# Patient Record
Sex: Female | Born: 1950 | Race: White | Hispanic: No | Marital: Married | Smoking: Never smoker
Health system: Southern US, Community
[De-identification: ages and names within clinical notes are randomized; demographics above are authoritative.]

## PROBLEM LIST (undated history)

## (undated) DIAGNOSIS — C4491 Basal cell carcinoma of skin, unspecified: Secondary | ICD-10-CM

## (undated) DIAGNOSIS — C4492 Squamous cell carcinoma of skin, unspecified: Secondary | ICD-10-CM

## (undated) DIAGNOSIS — Z789 Other specified health status: Secondary | ICD-10-CM

## (undated) DIAGNOSIS — Z9889 Other specified postprocedural states: Secondary | ICD-10-CM

## (undated) HISTORY — PX: COLONOSCOPY: SHX174

## (undated) HISTORY — PX: BREAST ENHANCEMENT SURGERY: SHX7

---
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
## 1979-09-12 HISTORY — PX: BREAST ENHANCEMENT SURGERY: SHX7

## 1990-09-11 HISTORY — PX: AUGMENTATION MAMMAPLASTY: SUR837

## 1998-05-26 ENCOUNTER — Other Ambulatory Visit: Admission: RE | Admit: 1998-05-26 | Discharge: 1998-05-26 | Payer: Self-pay | Admitting: Gynecology

## 1998-07-15 ENCOUNTER — Other Ambulatory Visit: Admission: RE | Admit: 1998-07-15 | Discharge: 1998-07-15 | Payer: Self-pay | Admitting: Internal Medicine

## 1998-11-25 ENCOUNTER — Other Ambulatory Visit: Admission: RE | Admit: 1998-11-25 | Discharge: 1998-11-25 | Payer: Self-pay | Admitting: Gynecology

## 1999-05-30 ENCOUNTER — Other Ambulatory Visit: Admission: RE | Admit: 1999-05-30 | Discharge: 1999-05-30 | Payer: Self-pay | Admitting: Gynecology

## 1999-12-05 ENCOUNTER — Other Ambulatory Visit: Admission: RE | Admit: 1999-12-05 | Discharge: 1999-12-05 | Payer: Self-pay | Admitting: Gynecology

## 2000-01-06 ENCOUNTER — Ambulatory Visit (HOSPITAL_COMMUNITY): Admission: RE | Admit: 2000-01-06 | Discharge: 2000-01-06 | Payer: Self-pay | Admitting: Gynecology

## 2000-01-06 ENCOUNTER — Encounter (INDEPENDENT_AMBULATORY_CARE_PROVIDER_SITE_OTHER): Payer: Self-pay | Admitting: Specialist

## 2000-04-02 ENCOUNTER — Other Ambulatory Visit: Admission: RE | Admit: 2000-04-02 | Discharge: 2000-04-02 | Payer: Self-pay | Admitting: Gynecology

## 2001-01-28 ENCOUNTER — Other Ambulatory Visit: Admission: RE | Admit: 2001-01-28 | Discharge: 2001-01-28 | Payer: Self-pay | Admitting: Gynecology

## 2001-10-24 ENCOUNTER — Other Ambulatory Visit: Admission: RE | Admit: 2001-10-24 | Discharge: 2001-10-24 | Payer: Self-pay | Admitting: Gynecology

## 2001-12-30 ENCOUNTER — Encounter: Admission: RE | Admit: 2001-12-30 | Discharge: 2001-12-30 | Payer: Self-pay | Admitting: Gynecology

## 2001-12-30 ENCOUNTER — Encounter: Payer: Self-pay | Admitting: Gynecology

## 2002-05-29 ENCOUNTER — Encounter: Admission: RE | Admit: 2002-05-29 | Discharge: 2002-05-29 | Payer: Self-pay | Admitting: Plastic Surgery

## 2002-05-29 ENCOUNTER — Encounter: Payer: Self-pay | Admitting: Plastic Surgery

## 2002-06-06 ENCOUNTER — Ambulatory Visit (HOSPITAL_COMMUNITY): Admission: RE | Admit: 2002-06-06 | Discharge: 2002-06-06 | Payer: Self-pay | Admitting: Plastic Surgery

## 2004-01-05 ENCOUNTER — Encounter: Admission: RE | Admit: 2004-01-05 | Discharge: 2004-01-05 | Payer: Self-pay | Admitting: Unknown Physician Specialty

## 2004-07-14 ENCOUNTER — Ambulatory Visit: Payer: Self-pay | Admitting: Internal Medicine

## 2004-09-08 ENCOUNTER — Ambulatory Visit: Payer: Self-pay | Admitting: Internal Medicine

## 2004-09-19 ENCOUNTER — Ambulatory Visit: Payer: Self-pay | Admitting: Internal Medicine

## 2005-03-22 ENCOUNTER — Encounter: Admission: RE | Admit: 2005-03-22 | Discharge: 2005-03-22 | Payer: Self-pay | Admitting: Unknown Physician Specialty

## 2005-05-11 ENCOUNTER — Encounter: Admission: RE | Admit: 2005-05-11 | Discharge: 2005-05-11 | Payer: Self-pay | Admitting: Unknown Physician Specialty

## 2005-10-18 ENCOUNTER — Encounter: Admission: RE | Admit: 2005-10-18 | Discharge: 2005-10-18 | Payer: Self-pay | Admitting: Unknown Physician Specialty

## 2005-10-25 ENCOUNTER — Encounter: Admission: RE | Admit: 2005-10-25 | Discharge: 2005-10-25 | Payer: Self-pay | Admitting: Unknown Physician Specialty

## 2005-11-15 ENCOUNTER — Encounter: Admission: RE | Admit: 2005-11-15 | Discharge: 2005-11-15 | Payer: Self-pay | Admitting: Unknown Physician Specialty

## 2006-01-03 ENCOUNTER — Encounter: Admission: RE | Admit: 2006-01-03 | Discharge: 2006-01-03 | Payer: Self-pay | Admitting: Unknown Physician Specialty

## 2006-01-05 ENCOUNTER — Ambulatory Visit: Payer: Self-pay | Admitting: Internal Medicine

## 2006-01-10 ENCOUNTER — Encounter: Admission: RE | Admit: 2006-01-10 | Discharge: 2006-01-10 | Payer: Self-pay | Admitting: Unknown Physician Specialty

## 2006-02-07 ENCOUNTER — Encounter: Admission: RE | Admit: 2006-02-07 | Discharge: 2006-02-07 | Payer: Self-pay | Admitting: Unknown Physician Specialty

## 2006-10-31 ENCOUNTER — Encounter: Admission: RE | Admit: 2006-10-31 | Discharge: 2006-10-31 | Payer: Self-pay | Admitting: Unknown Physician Specialty

## 2007-04-26 ENCOUNTER — Encounter: Admission: RE | Admit: 2007-04-26 | Discharge: 2007-04-26 | Payer: Self-pay | Admitting: *Deleted

## 2007-07-18 ENCOUNTER — Encounter: Admission: RE | Admit: 2007-07-18 | Discharge: 2007-07-18 | Payer: Self-pay | Admitting: *Deleted

## 2007-07-18 ENCOUNTER — Encounter: Admission: RE | Admit: 2007-07-18 | Discharge: 2007-07-18 | Payer: Self-pay | Admitting: Interventional Radiology

## 2007-09-18 IMAGING — US IR TRANSCATH EMBOLIZATION NON-NEURO EA OP FIELD
1 series · 4 of 4 positions shown · non-contrast
Comparison: none

CLINICAL DATA: Symptomatic left lower extremity varicose veins.

[Series 1: unknown · 0.07mm/px · 4 of 4 slices shown]
[im 1/4]
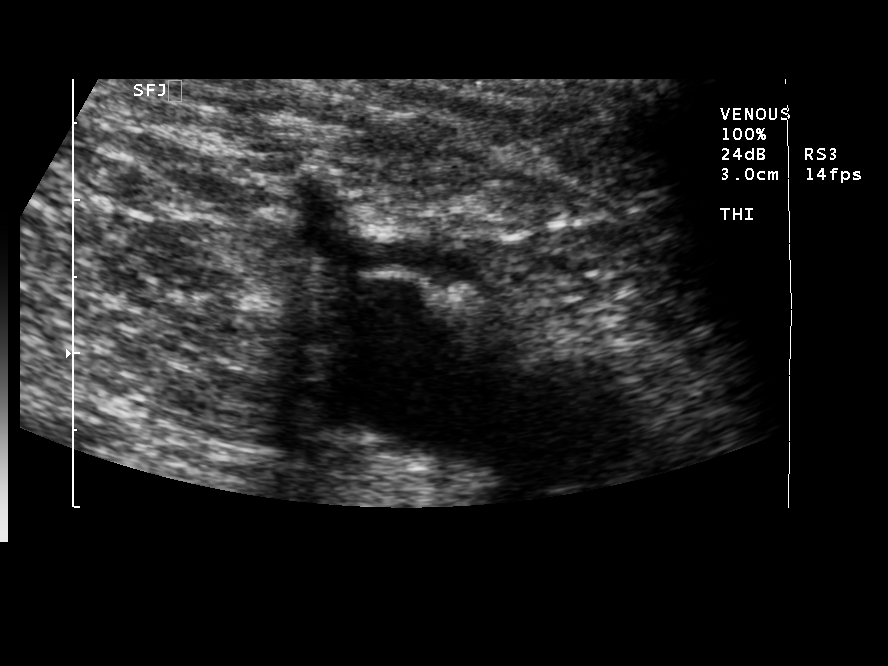
[im 2/4]
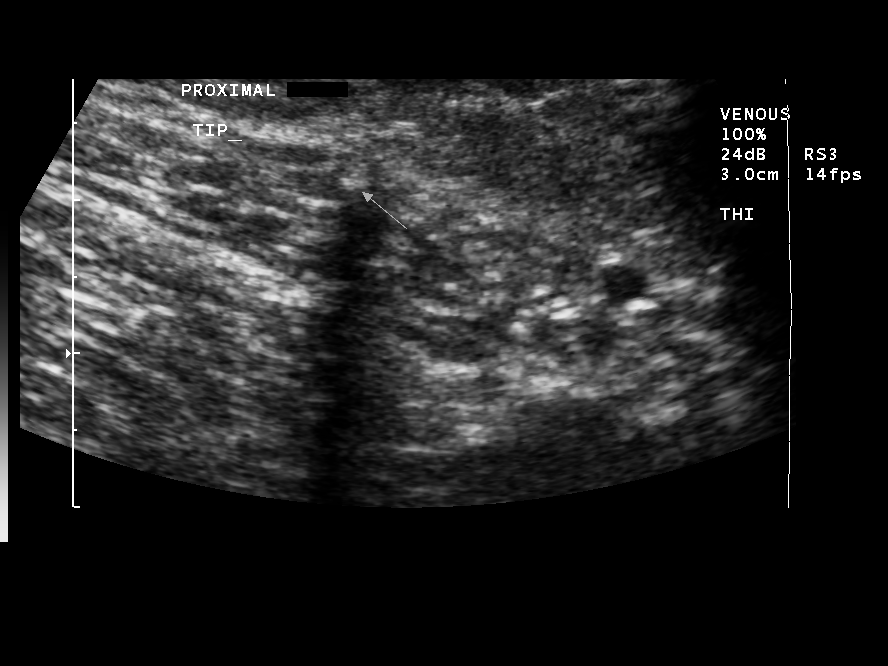
[im 3/4]
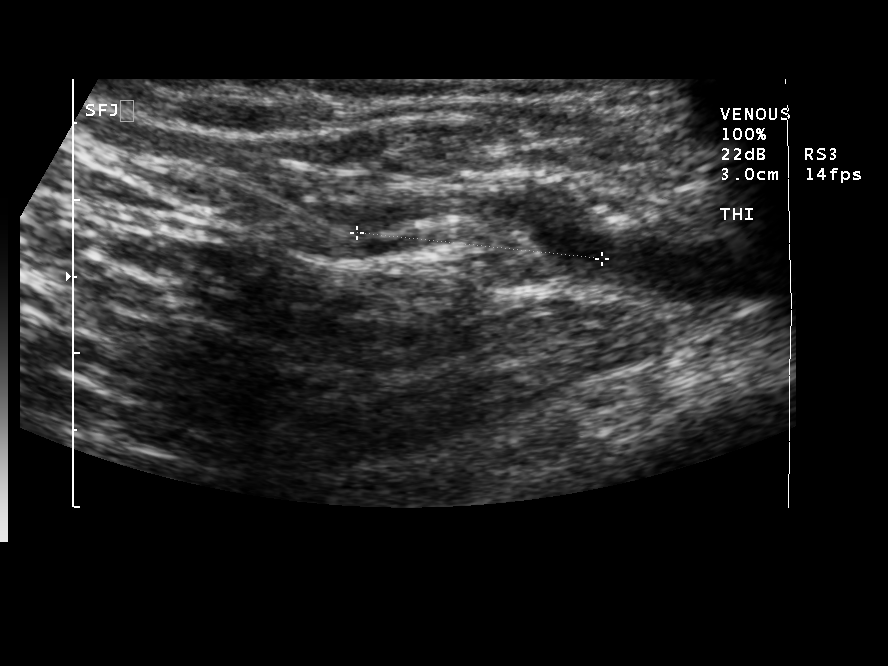
[im 4/4]
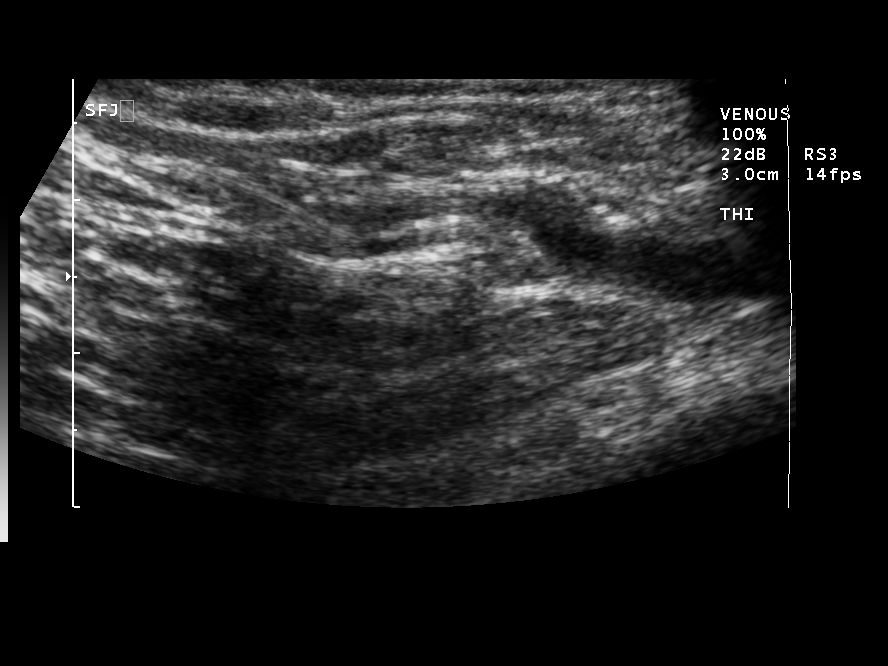

[4 of 4 positions shown; findings below may reference images not displayed]

TRANSCATHETER LASER OCCLUSION  LEFT  GREATER SAPHENOUS VEIN WITH ULTRASOUND
GUIDANCE:

Survey ultrasound of the leg was performed and the course of the greater
saphenous vein was marked on the skin. Overlying skin prepped with Betadine,
draped in usual sterile fashion. After local anesthetic using 1% lidocaine, the
greater saphenous vein was accessed at the lower calf level under ultrasound
with a 21-gauge micropuncture needle. A 018 guidewire advanced easily. This was
exchanged using a transitional dilator for a 035 J wire. Over this, the 6 French
delivery sheath was advanced beyond the saphenofemoral junction. The laser fiber
was advanced and positioned greater than 15 mm  from the saphenofemoral
junction. Tumescent dilute 0.1% lidocaine 350 mL used along the planned
treatment length. Ultrasound was used to confirm appropriate tumescent
anesthesia and to verify that all segments were at least 1 cm from the skin
surface. The laser fiber was activated while being withdrawn over the treatment
length, delivering 1466 joules over 261 seconds. The catheter and sheath were
removed and hemostasis easily achieved at the site. Patient was placed in
graduated compression stockings and ambulated for 20 minutes without difficulty.
Patient tolerated the procedure well, with no immediate complication.
IMPRESSION: 1. Technically successful transcatheter laser occlusion of left  greater
saphenous vein. Patient will followup in clinic in one week. Patient knows to

## 2008-01-21 ENCOUNTER — Encounter: Admission: RE | Admit: 2008-01-21 | Discharge: 2008-01-21 | Payer: Self-pay | Admitting: Unknown Physician Specialty

## 2008-06-04 ENCOUNTER — Ambulatory Visit (HOSPITAL_COMMUNITY)
Admission: RE | Admit: 2008-06-04 | Discharge: 2008-06-04 | Payer: Self-pay | Admitting: Physical Medicine and Rehabilitation

## 2009-06-03 ENCOUNTER — Encounter: Admission: RE | Admit: 2009-06-03 | Discharge: 2009-06-03 | Payer: Self-pay | Admitting: Unknown Physician Specialty

## 2010-10-01 ENCOUNTER — Encounter: Payer: Self-pay | Admitting: Unknown Physician Specialty

## 2010-10-01 ENCOUNTER — Encounter: Payer: Self-pay | Admitting: Interventional Radiology

## 2010-10-02 ENCOUNTER — Encounter: Payer: Self-pay | Admitting: Unknown Physician Specialty

## 2010-10-02 ENCOUNTER — Encounter: Payer: Self-pay | Admitting: Interventional Radiology

## 2010-10-03 ENCOUNTER — Encounter: Payer: Self-pay | Admitting: Unknown Physician Specialty

## 2011-01-27 NOTE — Op Note (Signed)
[facility]  Patient:    AFSHEEN, ANTONY                     MRN: [id number] Proc. Date: 01/06/00 Adm. Date:  [id number] Disc. Date: [id number] Attending:  Susa Raring                           Operative Report  PREOPERATIVE DIAGNOSIS: 1. Abnormal cervical cytology. 2. Inadequate colposcopy.  POSTOPERATIVE DIAGNOSIS: 1. Abnormal cervical cytology. 2. Inadequate colposcopy.  OPERATION PERFORMED:  Cold knife conization of the cervix and dilatation and curettage.  SURGEON:  Luvenia Redden, M.D.  DESCRIPTION OF PROCEDURE:  Under good anesthesia, the patient prepped and draped in sterile manner.  The bladder was catheterized.  Examination revealed the uterus  midposition, normal size, slightly irregular.  It is prolapsed 2 degrees. There are no masses in the pelvis.  The cervix was grasped at the 12 oclock position with the single-toothed tenaculum.  A 0 Vicryl suture was placed in several bites submucosally to surround the cervix and this was laid up on the abdomen.  A cone-shaped excisional biopsy was made in the central portion of the cervix. Biopsy specimen itself was marked at the 12 oclock position with a single suture. Following this some bleeders in the operative site were cauterized with electrocoagulization.  The internal os was dilated.  Thorough curettage was done and this was sent as a separate specimen.  Sound was placed in the canal.  The encerclage suture was tied down securely against the sound so as to ensure patency of the canal.  Operative site was observed for several minutes.  There was no excess bleeding.  The sponge was left in the vagina up next to the cervix and this will be removed in the recovery room in approximately 30 minutes.  Blood loss was approximately 50 cc.  None was replaced.  The patient tolerated the procedure well. She was removed to recovery in good condition. DD:  01/06/00 TD:  01/09/00 Job:  [id number]

## 2011-01-27 NOTE — Op Note (Signed)
[facility]  Patient:    Sydney Shaw, Sydney Shaw                     MRN: [id number] Proc. Date: 01/06/00 Adm. Date:  [id number] Attending:  Susa Raring                           Operative Report  PREOPERATIVE DIAGNOSES: 1. Abnormal cervical cytology. 2. Inadequate colposcopy.  POSTOPERATIVE DIAGNOSES: 1. Abnormal cervical cytology. 2. Inadequate colposcopy.  OPERATION:  Cold knife conization of the cervix and D&C.  SURGEON:  Luvenia Redden, M.D.  PROCEDURE:  Under good anesthesia, the patient was prepped and draped in a sterile manner.  The bladder was catheterized.  Examination revealed the uterus mid position, normal size, slightly irregular.  It is prolapsed two degrees.  There are no masses in the pelvis.  Cervix was grasped at 12 oclock position with a single tooth tenaculum.  A 0 Vicryl suture was placed in several bites submucosally to  surround the cervix and this was laid up on the abdomen then.  A cone shaped excisional biopsy was made in the central portion of the cervix.  Biopsy specimen itself was marked at the 12 oclock position with a single suture.  Following this some bleeders in the operative site were cauterized with electrocoagulation. Internal os was dilated.  Thorough curettage was done and this was sent as a separate specimen.  _________ was placed in the canal.  The cerclage suture was  then tied down securely against the sound to ensure patency of the canal. Operative site was observed for several minutes.  There was no excess bleeding.  Sponge was left in the vagina up next to the cervix and this will be removed in the recovery room in approximately 30 minutes.  BLOOD LOSS:  Blood loss was approximately 50 cc.  None was replaced.  Patient tolerated the procedure well and she was removed to recovery in good condition. DD:  01/06/00 TD:  01/08/00 Job: [id number]

## 2011-04-17 ENCOUNTER — Other Ambulatory Visit: Payer: Self-pay | Admitting: Unknown Physician Specialty

## 2011-04-17 DIAGNOSIS — Z1231 Encounter for screening mammogram for malignant neoplasm of breast: Secondary | ICD-10-CM

## 2011-05-23 ENCOUNTER — Ambulatory Visit
Admission: RE | Admit: 2011-05-23 | Discharge: 2011-05-23 | Disposition: A | Discharge status: Home / Self Care | Payer: 59 | Source: Ambulatory Visit | Attending: Unknown Physician Specialty | Admitting: Unknown Physician Specialty

## 2011-05-23 DIAGNOSIS — Z1231 Encounter for screening mammogram for malignant neoplasm of breast: Secondary | ICD-10-CM

## 2012-05-16 ENCOUNTER — Other Ambulatory Visit: Payer: Self-pay | Admitting: Unknown Physician Specialty

## 2012-05-16 DIAGNOSIS — Z1231 Encounter for screening mammogram for malignant neoplasm of breast: Secondary | ICD-10-CM

## 2012-06-13 ENCOUNTER — Ambulatory Visit
Admission: RE | Admit: 2012-06-13 | Discharge: 2012-06-13 | Disposition: A | Discharge status: Home / Self Care | Payer: 59 | Source: Ambulatory Visit | Attending: Unknown Physician Specialty | Admitting: Unknown Physician Specialty

## 2012-06-13 DIAGNOSIS — Z1231 Encounter for screening mammogram for malignant neoplasm of breast: Secondary | ICD-10-CM

## 2013-06-02 ENCOUNTER — Other Ambulatory Visit: Payer: Self-pay

## 2013-06-02 DIAGNOSIS — Z1231 Encounter for screening mammogram for malignant neoplasm of breast: Secondary | ICD-10-CM

## 2013-06-23 ENCOUNTER — Ambulatory Visit
Admission: RE | Admit: 2013-06-23 | Discharge: 2013-06-23 | Disposition: A | Discharge status: Home / Self Care | Payer: 59 | Source: Ambulatory Visit

## 2013-06-23 DIAGNOSIS — Z1231 Encounter for screening mammogram for malignant neoplasm of breast: Secondary | ICD-10-CM

## 2014-05-25 ENCOUNTER — Other Ambulatory Visit: Payer: Self-pay

## 2014-05-25 DIAGNOSIS — Z1231 Encounter for screening mammogram for malignant neoplasm of breast: Secondary | ICD-10-CM

## 2014-07-14 ENCOUNTER — Ambulatory Visit
Admission: RE | Admit: 2014-07-14 | Discharge: 2014-07-14 | Disposition: A | Discharge status: Home / Self Care | Payer: 59 | Source: Ambulatory Visit

## 2014-07-14 DIAGNOSIS — Z1231 Encounter for screening mammogram for malignant neoplasm of breast: Secondary | ICD-10-CM

## 2015-06-25 ENCOUNTER — Telehealth: Payer: Self-pay

## 2015-06-25 NOTE — Telephone Encounter (Signed)
PATIENT CALLED TO SCHEDULE HER TCS  RECEIVED LETTER IN June  [phone]

## 2015-06-30 ENCOUNTER — Telehealth: Payer: Self-pay

## 2015-06-30 ENCOUNTER — Other Ambulatory Visit: Payer: Self-pay

## 2015-06-30 DIAGNOSIS — Z1211 Encounter for screening for malignant neoplasm of colon: Secondary | ICD-10-CM

## 2015-06-30 NOTE — Telephone Encounter (Signed)
Hold iron 7 days.  Let's do suprep.

## 2015-06-30 NOTE — Telephone Encounter (Signed)
Gastroenterology Pre-Procedure Review  Request Date: 06/30/2015 Requesting Physician: Cathlean Sauer PA [facility])   PATIENT REVIEW QUESTIONS: The patient responded to the following health history questions as indicated:    Pt said she had one colonoscopy about 1998 by Delfin Edis at [facility] Next was recommended in 10 years per her  Pt said she absolutely cannot drink the regular preps/ she was given Mag Citrate and Doculax previously  1. Diabetes Melitis: no 2. Joint replacements in the past 12 months: no 3. Major health problems in the past 3 months: no 4. Has an artificial valve or MVP: no 5. Has a defibrillator: no 6. Has been advised in past to take antibiotics in advance of a procedure like teeth cleaning: no 7. Family history of colon cancer: no  8. Alcohol Use: no 9. History of sleep apnea: no     MEDICATIONS & ALLERGIES:    Patient reports the following regarding taking any blood thinners:   Plavix? no Aspirin? no Coumadin? no  Patient confirms/reports the following medications:  Current Outpatient Prescriptions  Medication Sig Dispense Refill  . Biotin 1000 MCG tablet Take 1,000 mcg by mouth daily.    Marland Kitchen estradiol (ESTRACE) 2 MG tablet Take 2 mg by mouth daily.    Marland Kitchen gabapentin (NEURONTIN) 300 MG capsule Take 300 mg by mouth daily.    . medroxyPROGESTERone (PROVERA) 2.5 MG tablet Take 2.5 mg by mouth daily.    . NON FORMULARY Iron  27 mg daily    . NON FORMULARY Chewable Calcium     Not sure strength   One daily    . NON FORMULARY Probiotic   daily     No current facility-administered medications for this visit.    Patient confirms/reports the following allergies:  No Known Allergies  No orders of the defined types were placed in this encounter.    AUTHORIZATION INFORMATION Primary Insurance:  ID #:   Group #:  Pre-Cert / Auth required:  Pre-Cert / Auth #:   Secondary Insurance:  ID #:   Group #:  Pre-Cert / Auth required:  Pre-Cert / Auth #:    SCHEDULE INFORMATION: Procedure has been scheduled as follows:  Date:  07/16/2015               Time:  10:45 AM Location: [facility] Short Stay  This Gastroenterology Pre-Precedure Review Form is being routed to the following provider(s): R. Garfield Cornea, MD

## 2015-07-01 MED ORDER — NA SULFATE-K SULFATE-MG SULF 17.5-3.13-1.6 GM/177ML PO SOLN: 1.0000 | ORAL | Status: DC

## 2015-07-01 NOTE — Telephone Encounter (Signed)
Rx sent to the pharmacy and instructions mailed to pt.  

## 2015-07-14 ENCOUNTER — Telehealth: Payer: Self-pay

## 2015-07-14 NOTE — Telephone Encounter (Signed)
I called [employer] @ [phone] and spoke to [place] who said a PA is required for the screening colonoscopy as out patient at facility.  Pending Reference # [id number].   She is placing on high priority.

## 2015-07-16 ENCOUNTER — Ambulatory Visit (HOSPITAL_COMMUNITY)
Admission: RE | Admit: 2015-07-16 | Discharge: 2015-07-16 | Disposition: A | Discharge status: Home / Self Care | Payer: 59 | Source: Ambulatory Visit | Attending: Internal Medicine | Admitting: Internal Medicine

## 2015-07-16 ENCOUNTER — Encounter (HOSPITAL_COMMUNITY)
Admission: RE | Disposition: A | Discharge status: Home / Self Care | Payer: Self-pay | Source: Ambulatory Visit | Attending: Internal Medicine

## 2015-07-16 ENCOUNTER — Encounter (HOSPITAL_COMMUNITY): Payer: Self-pay | Admitting: *Deleted

## 2015-07-16 DIAGNOSIS — Z1211 Encounter for screening for malignant neoplasm of colon: Secondary | ICD-10-CM | POA: Diagnosis not present

## 2015-07-16 DIAGNOSIS — Z86010 Personal history of colonic polyps: Secondary | ICD-10-CM | POA: Insufficient documentation

## 2015-07-16 DIAGNOSIS — Z79899 Other long term (current) drug therapy: Secondary | ICD-10-CM | POA: Insufficient documentation

## 2015-07-16 DIAGNOSIS — D124 Benign neoplasm of descending colon: Secondary | ICD-10-CM | POA: Diagnosis not present

## 2015-07-16 DIAGNOSIS — D122 Benign neoplasm of ascending colon: Secondary | ICD-10-CM | POA: Insufficient documentation

## 2015-07-16 HISTORY — DX: Other specified health status: Z78.9

## 2015-07-16 HISTORY — PX: COLONOSCOPY: SHX5424

## 2015-07-16 SURGERY — COLONOSCOPY
Anesthesia: Moderate Sedation

## 2015-07-16 MED ORDER — MEPERIDINE HCL 100 MG/ML IJ SOLN
INTRAMUSCULAR | Status: AC
  Filled 2015-07-16: qty 2

## 2015-07-16 MED ORDER — ONDANSETRON HCL 4 MG/2ML IJ SOLN
INTRAMUSCULAR | Status: AC
  Filled 2015-07-16: qty 2

## 2015-07-16 MED ORDER — STERILE WATER FOR IRRIGATION IR SOLN
Status: DC | PRN
  Administered 2015-07-16: 10:00:00

## 2015-07-16 MED ORDER — ONDANSETRON HCL 4 MG/2ML IJ SOLN
INTRAMUSCULAR | Status: DC | PRN
  Administered 2015-07-16: 4 mg via INTRAVENOUS

## 2015-07-16 MED ORDER — MEPERIDINE HCL 100 MG/ML IJ SOLN
INTRAMUSCULAR | Status: DC | PRN
  Administered 2015-07-16: 50 mg via INTRAVENOUS

## 2015-07-16 MED ORDER — SODIUM CHLORIDE 0.9 % IV SOLN
INTRAVENOUS | Status: DC
  Administered 2015-07-16: 1000 mL via INTRAVENOUS

## 2015-07-16 MED ORDER — MIDAZOLAM HCL 5 MG/5ML IJ SOLN
INTRAMUSCULAR | Status: DC | PRN
  Administered 2015-07-16: 1 mg via INTRAVENOUS
  Administered 2015-07-16: 2 mg via INTRAVENOUS
  Administered 2015-07-16: 1 mg via INTRAVENOUS

## 2015-07-16 MED ORDER — MIDAZOLAM HCL 5 MG/5ML IJ SOLN
INTRAMUSCULAR | Status: AC
  Filled 2015-07-16: qty 10

## 2015-07-16 NOTE — Op Note (Signed)
[facility] [address] [place], [address]   COLONOSCOPY PROCEDURE REPORT  PATIENT: Sydney Shaw, Sydney Shaw  MR#: [id number] BIRTHDATE: 1951-05-09 , 67  yrs. old GENDER: female ENDOSCOPIST: R.  Garfield Cornea, MD FACP [facility] REFERRED KW:IOXBD Quillian Quince, M.D. PROCEDURE DATE:  08-05-15 PROCEDURE:   Colonoscopy with snare polypectomy INDICATIONS:Average risk colorectal cancer screening examination. MEDICATIONS: Versed 4 mg IV and Demerol 50 mg IV in divided doses. Zofran 4 mg IV. ASA CLASS:       Class II  CONSENT: The risks, benefits, alternatives and imponderables including but not limited to bleeding, perforation as well as the possibility of a missed lesion have been reviewed.  The potential for biopsy, lesion removal, etc. have also been discussed. Questions have been answered.  All parties agreeable.  Please see the history and physical in the medical record for more information.  DESCRIPTION OF PROCEDURE:   After the risks benefits and alternatives of the procedure were thoroughly explained, informed consent was obtained.  The digital rectal exam revealed no abnormalities of the rectum.   The EC-[id number] ([id number])  endoscope was introduced through the anus and advanced to the cecum, which was identified by both the appendix and ileocecal valve. No adverse events experienced.   The quality of the prep was adequate  The instrument was then slowly withdrawn as the colon was fully examined. Estimated blood loss is zero unless otherwise noted in this procedure report.      COLON FINDINGS: Normal-appearing rectal mucosa.  (1) 5 millimeter polyp in the mid descending segment and (1) 5 mm polyp in the mid ascending segment; otherwise, the remainder of the colonic mucosa appeared normal.  The above-mentioned polyps were cold snare removed.  Retroflexion was performed. .  Withdrawal time=6 minutes 0 seconds.  The scope was withdrawn and the procedure  completed. COMPLICATIONS: There were no immediate complications. EBL 2 mL ENDOSCOPIC IMPRESSION: Multiple colonic polyps?"removed as described above  RECOMMENDATIONS: Follow up on pathology.  eSigned:  R. Garfield Cornea, MD [facility]alita [facility]ss[facility] 08-05-15 10:51 AM   cc:  CPT CODES: ICD CODES:  The ICD and CPT codes recommended by this software are interpretations from the data that the clinical staff has captured with the software.  The verification of the translation of this report to the ICD and CPT codes and modifiers is the sole responsibility of the health care institution and practicing physician where this report was generated.  [employer] will not be held responsible for the validity of the ICD and CPT codes included on this report.  AMA assumes no liability for data contained or not contained herein. CPT is a Designer, television/film set of the [employer].  PATIENT NAME:  Sydney Shaw, Sydney Shaw MR#: [id number]

## 2015-07-16 NOTE — Discharge Instructions (Signed)
°Colonoscopy °Discharge Instructions ° °Read the instructions outlined below and refer to this sheet in the next few weeks. These discharge instructions provide you with general information on caring for yourself after you leave the hospital. Your doctor may also give you specific instructions. While your treatment has been planned according to the most current medical practices available, unavoidable complications occasionally occur. If you have any problems or questions after discharge, call Dr. Rourk at [phone]. °ACTIVITY °· You may resume your regular activity, but move at a slower pace for the next 24 hours.  °· Take frequent rest periods for the next 24 hours.  °· Walking will help get rid of the air and reduce the bloated feeling in your belly (abdomen).  °· No driving for 24 hours (because of the medicine (anesthesia) used during the test).   °· Do not sign any important legal documents or operate any machinery for 24 hours (because of the anesthesia used during the test).  °NUTRITION °· Drink plenty of fluids.  °· You may resume your normal diet as instructed by your doctor.  °· Begin with a light meal and progress to your normal diet. Heavy or fried foods are harder to digest and may make you feel sick to your stomach (nauseated).  °· Avoid alcoholic beverages for 24 hours or as instructed.  °MEDICATIONS °· You may resume your normal medications unless your doctor tells you otherwise.  °WHAT YOU CAN EXPECT TODAY °· Some feelings of bloating in the abdomen.  °· Passage of more gas than usual.  °· Spotting of blood in your stool or on the toilet paper.  °IF YOU HAD POLYPS REMOVED DURING THE COLONOSCOPY: °· No aspirin products for 7 days or as instructed.  °· No alcohol for 7 days or as instructed.  °· Eat a soft diet for the next 24 hours.  °FINDING OUT THE RESULTS OF YOUR TEST °Not all test results are available during your visit. If your test results are not back during the visit, make an appointment  with your caregiver to find out the results. Do not assume everything is normal if you have not heard from your caregiver or the medical facility. It is important for you to follow up on all of your test results.  °SEEK IMMEDIATE MEDICAL ATTENTION IF: °· You have more than a spotting of blood in your stool.  °· Your belly is swollen (abdominal distention).  °· You are nauseated or vomiting.  °· You have a temperature over 101.  °· You have abdominal pain or discomfort that is severe or gets worse throughout the day.  ° °Polyp information provided ° °Further recommendations to follow pending review of pathology report ° °Colon Polyps °Polyps are lumps of extra tissue growing inside the body. Polyps can grow in the large intestine (colon). Most colon polyps are noncancerous (benign). However, some colon polyps can become cancerous over time. Polyps that are larger than a pea may be harmful. To be safe, caregivers remove and test all polyps. °CAUSES  °Polyps form when mutations in the genes cause your cells to grow and divide even though no more tissue is needed. °RISK FACTORS °There are a number of risk factors that can increase your chances of getting colon polyps. They include: °· Being older than 50 years. °· Family history of colon polyps or colon cancer. °· Long-term colon diseases, such as colitis or Crohn disease. °· Being overweight. °· Smoking. °· Being inactive. °· Drinking too much alcohol. °SYMPTOMS  °  Most small polyps do not cause symptoms. If symptoms are present, they may include: °· Blood in the stool. The stool may look dark red or black. °· Constipation or diarrhea that lasts longer than 1 week. °DIAGNOSIS °People often do not know they have polyps until their caregiver finds them during a regular checkup. Your caregiver can use 4 tests to check for polyps: °· Digital rectal exam. The caregiver wears gloves and feels inside the rectum. This test would find polyps only in the rectum. °· Barium enema.  The caregiver puts a liquid called barium into your rectum before taking X-rays of your colon. Barium makes your colon look white. Polyps are dark, so they are easy to see in the X-ray pictures. °· Sigmoidoscopy. A thin, flexible tube (sigmoidoscope) is placed into your rectum. The sigmoidoscope has a light and tiny camera in it. The caregiver uses the sigmoidoscope to look at the last third of your colon. °· Colonoscopy. This test is like sigmoidoscopy, but the caregiver looks at the entire colon. This is the most common method for finding and removing polyps. °TREATMENT  °Any polyps will be removed during a sigmoidoscopy or colonoscopy. The polyps are then tested for cancer. °PREVENTION  °To help lower your risk of getting more colon polyps: °· Eat plenty of fruits and vegetables. Avoid eating fatty foods. °· Do not smoke. °· Avoid drinking alcohol. °· Exercise every day. °· Lose weight if recommended by your caregiver. °· Eat plenty of calcium and folate. Foods that are rich in calcium include milk, cheese, and broccoli. Foods that are rich in folate include chickpeas, kidney beans, and spinach. °HOME CARE INSTRUCTIONS °Keep all follow-up appointments as directed by your caregiver. You may need periodic exams to check for polyps. °SEEK MEDICAL CARE IF: °You notice bleeding during a bowel movement. °  °This information is not intended to replace advice given to you by your health care provider. Make sure you discuss any questions you have with your health care provider. °  °Document Released: 05/24/2004 Document Revised: 09/18/2014 Document Reviewed: 11/07/2011 °Elsevier Interactive Patient Education ©2016 Elsevier Inc. ° °

## 2015-07-16 NOTE — H&P (Signed)
@  SHUO@   Primary Care Physician:  Gar Ponto, MD Primary Gastroenterologist:  Dr. Gala Romney  Pre-Procedure History & Physical: HPI:  MARION SEESE is a 64 y.o. female is here for a screening colonoscopy. No bowel symptoms. Last colonoscopy reportedly negative in [place] 18 years ago. No family history of colon cancer.  Past Medical History  Diagnosis Date  . Medical history non-contributory     Past Surgical History  Procedure Laterality Date  . Breast enhancement surgery    . Colonoscopy      Prior to Admission medications   Medication Sig Start Date End Date Taking? Authorizing Provider  Biotin 1000 MCG tablet Take 1,000 mcg by mouth daily.   Yes Historical Provider, MD  gabapentin (NEURONTIN) 300 MG capsule Take 300 mg by mouth daily.   Yes Historical Provider, MD  Na Sulfate-K Sulfate-Mg Sulf (SUPREP BOWEL PREP) SOLN Take 1 kit by mouth as directed. 07/01/15  Yes Daneil Dolin, MD  NON FORMULARY Take 1 tablet by mouth daily. Chewable Calcium     Not sure strength   One daily   Yes Historical Provider, MD  NON FORMULARY Take 1 capsule by mouth daily. Probiotic   daily   Yes Historical Provider, MD  NON FORMULARY Iron  27 mg daily    Historical Provider, MD    Allergies as of 06/30/2015  . (No Known Allergies)    No family history on file.  Social History   Social History  . Marital Status: Married    Spouse Name: N/A  . Number of Children: N/A  . Years of Education: N/A   Occupational History  . Not on file.   Social History Main Topics  . Smoking status: Never Smoker   . Smokeless tobacco: Not on file  . Alcohol Use: No  . Drug Use: No  . Sexual Activity: Not on file   Other Topics Concern  . Not on file   Social History Narrative  . No narrative on file    Review of Systems: See HPI, otherwise negative ROS  Physical Exam: BP 152/75 mmHg  Pulse 71  Temp(Src) 97.6 F (36.4 C) (Oral)  Resp 15  Ht _0  (1.626 m)  Wt 110 lb (49.896 kg)   BMI 18.87 kg/m2  SpO2 100% General:   Alert,  Well-developed, well-nourished, pleasant and cooperative in NAD Head:  Normocephalic and atraumatic. Eyes:  Sclera clear, no icterus.   Conjunctiva pink. Lungs:  Clear throughout to auscultation.   No wheezes, crackles, or rhonchi. No acute distress. Heart:  Regular rate and rhythm; no murmurs, clicks, rubs,  or gallops. Abdomen:  Soft, nontender and nondistended. No masses, hepatosplenomegaly or hernias noted. Normal bowel sounds, without guarding, and without rebound.     Impression/Plan: GRACILYN GUNIA is now here to undergo a screening colonoscopy.  Average risk screening examination.  Risks, benefits, limitations, imponderables and alternatives regarding colonoscopy have been reviewed with the patient. Questions have been answered. All parties agreeable.     Notice:  This dictation was prepared with Dragon dictation along with smaller phrase technology. Any transcriptional errors that result from this process are unintentional and may not be corrected upon review.

## 2015-07-19 ENCOUNTER — Encounter: Payer: Self-pay | Admitting: Internal Medicine

## 2015-07-21 ENCOUNTER — Encounter (HOSPITAL_COMMUNITY): Payer: Self-pay | Admitting: Internal Medicine

## 2015-07-22 ENCOUNTER — Other Ambulatory Visit: Payer: Self-pay

## 2016-05-02 ENCOUNTER — Other Ambulatory Visit: Payer: Self-pay | Admitting: Dermatology

## 2016-05-02 DIAGNOSIS — C4491 Basal cell carcinoma of skin, unspecified: Secondary | ICD-10-CM

## 2016-05-02 HISTORY — DX: Basal cell carcinoma of skin, unspecified: C44.91

## 2017-04-03 DIAGNOSIS — N951 Menopausal and female climacteric states: Secondary | ICD-10-CM | POA: Diagnosis not present

## 2017-05-16 DIAGNOSIS — Z0100 Encounter for examination of eyes and vision without abnormal findings: Secondary | ICD-10-CM | POA: Diagnosis not present

## 2017-05-16 DIAGNOSIS — H2510 Age-related nuclear cataract, unspecified eye: Secondary | ICD-10-CM | POA: Diagnosis not present

## 2017-07-26 DIAGNOSIS — M25552 Pain in left hip: Secondary | ICD-10-CM | POA: Diagnosis not present

## 2017-08-06 DIAGNOSIS — M25552 Pain in left hip: Secondary | ICD-10-CM | POA: Diagnosis not present

## 2017-09-19 DIAGNOSIS — M1612 Unilateral primary osteoarthritis, left hip: Secondary | ICD-10-CM | POA: Diagnosis not present

## 2017-10-10 DIAGNOSIS — Z681 Body mass index (BMI) 19 or less, adult: Secondary | ICD-10-CM | POA: Diagnosis not present

## 2017-10-10 DIAGNOSIS — J209 Acute bronchitis, unspecified: Secondary | ICD-10-CM | POA: Diagnosis not present

## 2017-10-10 DIAGNOSIS — J0101 Acute recurrent maxillary sinusitis: Secondary | ICD-10-CM | POA: Diagnosis not present

## 2017-10-18 DIAGNOSIS — Z01419 Encounter for gynecological examination (general) (routine) without abnormal findings: Secondary | ICD-10-CM | POA: Diagnosis not present

## 2017-10-18 DIAGNOSIS — Z1231 Encounter for screening mammogram for malignant neoplasm of breast: Secondary | ICD-10-CM | POA: Diagnosis not present

## 2017-10-18 DIAGNOSIS — Z124 Encounter for screening for malignant neoplasm of cervix: Secondary | ICD-10-CM | POA: Diagnosis not present

## 2017-10-18 DIAGNOSIS — R208 Other disturbances of skin sensation: Secondary | ICD-10-CM | POA: Diagnosis not present

## 2017-10-23 ENCOUNTER — Other Ambulatory Visit: Payer: Self-pay | Admitting: Obstetrics & Gynecology

## 2017-10-23 DIAGNOSIS — E2839 Other primary ovarian failure: Secondary | ICD-10-CM

## 2017-11-09 DIAGNOSIS — M25552 Pain in left hip: Secondary | ICD-10-CM | POA: Diagnosis not present

## 2018-06-20 DIAGNOSIS — R69 Illness, unspecified: Secondary | ICD-10-CM | POA: Diagnosis not present

## 2018-06-25 DIAGNOSIS — M13841 Other specified arthritis, right hand: Secondary | ICD-10-CM | POA: Diagnosis not present

## 2018-07-01 DIAGNOSIS — Z23 Encounter for immunization: Secondary | ICD-10-CM | POA: Diagnosis not present

## 2018-07-01 DIAGNOSIS — Z681 Body mass index (BMI) 19 or less, adult: Secondary | ICD-10-CM | POA: Diagnosis not present

## 2018-07-01 DIAGNOSIS — Z0000 Encounter for general adult medical examination without abnormal findings: Secondary | ICD-10-CM | POA: Diagnosis not present

## 2018-07-01 DIAGNOSIS — E559 Vitamin D deficiency, unspecified: Secondary | ICD-10-CM | POA: Diagnosis not present

## 2018-07-04 DIAGNOSIS — M810 Age-related osteoporosis without current pathological fracture: Secondary | ICD-10-CM | POA: Diagnosis not present

## 2018-07-04 DIAGNOSIS — M85852 Other specified disorders of bone density and structure, left thigh: Secondary | ICD-10-CM | POA: Diagnosis not present

## 2018-07-18 DIAGNOSIS — R69 Illness, unspecified: Secondary | ICD-10-CM | POA: Diagnosis not present

## 2018-07-30 ENCOUNTER — Other Ambulatory Visit: Payer: Self-pay | Admitting: Dermatology

## 2018-07-30 DIAGNOSIS — C4492 Squamous cell carcinoma of skin, unspecified: Secondary | ICD-10-CM

## 2018-07-30 DIAGNOSIS — D044 Carcinoma in situ of skin of scalp and neck: Secondary | ICD-10-CM | POA: Diagnosis not present

## 2018-07-30 DIAGNOSIS — C4491 Basal cell carcinoma of skin, unspecified: Secondary | ICD-10-CM

## 2018-07-30 DIAGNOSIS — D485 Neoplasm of uncertain behavior of skin: Secondary | ICD-10-CM | POA: Diagnosis not present

## 2018-07-30 DIAGNOSIS — C44511 Basal cell carcinoma of skin of breast: Secondary | ICD-10-CM | POA: Diagnosis not present

## 2018-07-30 DIAGNOSIS — L570 Actinic keratosis: Secondary | ICD-10-CM | POA: Diagnosis not present

## 2018-07-30 DIAGNOSIS — D0471 Carcinoma in situ of skin of right lower limb, including hip: Secondary | ICD-10-CM | POA: Diagnosis not present

## 2018-07-30 HISTORY — DX: Squamous cell carcinoma of skin, unspecified: C44.92

## 2018-07-30 HISTORY — DX: Basal cell carcinoma of skin, unspecified: C44.91

## 2018-08-26 DIAGNOSIS — Z681 Body mass index (BMI) 19 or less, adult: Secondary | ICD-10-CM | POA: Diagnosis not present

## 2018-08-26 DIAGNOSIS — J189 Pneumonia, unspecified organism: Secondary | ICD-10-CM | POA: Diagnosis not present

## 2018-09-02 ENCOUNTER — Other Ambulatory Visit: Payer: Self-pay | Admitting: Dermatology

## 2018-09-02 DIAGNOSIS — D0471 Carcinoma in situ of skin of right lower limb, including hip: Secondary | ICD-10-CM | POA: Diagnosis not present

## 2018-09-02 DIAGNOSIS — C4442 Squamous cell carcinoma of skin of scalp and neck: Secondary | ICD-10-CM | POA: Diagnosis not present

## 2018-09-02 DIAGNOSIS — C44519 Basal cell carcinoma of skin of other part of trunk: Secondary | ICD-10-CM | POA: Diagnosis not present

## 2018-09-02 DIAGNOSIS — D044 Carcinoma in situ of skin of scalp and neck: Secondary | ICD-10-CM | POA: Diagnosis not present

## 2019-02-04 DIAGNOSIS — R69 Illness, unspecified: Secondary | ICD-10-CM | POA: Diagnosis not present

## 2019-03-04 DIAGNOSIS — M542 Cervicalgia: Secondary | ICD-10-CM | POA: Diagnosis not present

## 2019-03-04 DIAGNOSIS — R51 Headache: Secondary | ICD-10-CM | POA: Diagnosis not present

## 2019-03-04 DIAGNOSIS — Z6820 Body mass index (BMI) 20.0-20.9, adult: Secondary | ICD-10-CM | POA: Diagnosis not present

## 2019-03-10 DIAGNOSIS — L821 Other seborrheic keratosis: Secondary | ICD-10-CM | POA: Diagnosis not present

## 2019-03-10 DIAGNOSIS — D229 Melanocytic nevi, unspecified: Secondary | ICD-10-CM | POA: Diagnosis not present

## 2019-03-10 DIAGNOSIS — D692 Other nonthrombocytopenic purpura: Secondary | ICD-10-CM | POA: Diagnosis not present

## 2019-04-18 DIAGNOSIS — M4312 Spondylolisthesis, cervical region: Secondary | ICD-10-CM | POA: Diagnosis not present

## 2019-04-18 DIAGNOSIS — M47812 Spondylosis without myelopathy or radiculopathy, cervical region: Secondary | ICD-10-CM | POA: Diagnosis not present

## 2019-04-18 DIAGNOSIS — J984 Other disorders of lung: Secondary | ICD-10-CM | POA: Diagnosis not present

## 2019-04-18 DIAGNOSIS — M542 Cervicalgia: Secondary | ICD-10-CM | POA: Diagnosis not present

## 2019-04-18 DIAGNOSIS — M4802 Spinal stenosis, cervical region: Secondary | ICD-10-CM | POA: Diagnosis not present

## 2019-04-18 DIAGNOSIS — M4050 Lordosis, unspecified, site unspecified: Secondary | ICD-10-CM | POA: Diagnosis not present

## 2019-04-18 DIAGNOSIS — M2578 Osteophyte, vertebrae: Secondary | ICD-10-CM | POA: Diagnosis not present

## 2019-04-18 DIAGNOSIS — M5021 Other cervical disc displacement,  high cervical region: Secondary | ICD-10-CM | POA: Diagnosis not present

## 2019-04-18 DIAGNOSIS — M5020 Other cervical disc displacement, unspecified cervical region: Secondary | ICD-10-CM | POA: Diagnosis not present

## 2019-05-05 DIAGNOSIS — R51 Headache: Secondary | ICD-10-CM | POA: Diagnosis not present

## 2019-05-22 DIAGNOSIS — R69 Illness, unspecified: Secondary | ICD-10-CM | POA: Diagnosis not present

## 2019-08-06 ENCOUNTER — Other Ambulatory Visit: Payer: Self-pay | Admitting: Dermatology

## 2019-08-06 DIAGNOSIS — D692 Other nonthrombocytopenic purpura: Secondary | ICD-10-CM | POA: Diagnosis not present

## 2019-08-06 DIAGNOSIS — D485 Neoplasm of uncertain behavior of skin: Secondary | ICD-10-CM | POA: Diagnosis not present

## 2019-08-12 DIAGNOSIS — R69 Illness, unspecified: Secondary | ICD-10-CM | POA: Diagnosis not present

## 2019-08-25 DIAGNOSIS — R69 Illness, unspecified: Secondary | ICD-10-CM | POA: Diagnosis not present

## 2019-09-14 DIAGNOSIS — R5383 Other fatigue: Secondary | ICD-10-CM | POA: Diagnosis not present

## 2019-09-14 DIAGNOSIS — R05 Cough: Secondary | ICD-10-CM | POA: Diagnosis not present

## 2019-09-14 DIAGNOSIS — Z20822 Contact with and (suspected) exposure to covid-19: Secondary | ICD-10-CM | POA: Diagnosis not present

## 2019-09-14 DIAGNOSIS — R197 Diarrhea, unspecified: Secondary | ICD-10-CM | POA: Diagnosis not present

## 2019-09-14 DIAGNOSIS — R509 Fever, unspecified: Secondary | ICD-10-CM | POA: Diagnosis not present

## 2019-09-26 DIAGNOSIS — H2513 Age-related nuclear cataract, bilateral: Secondary | ICD-10-CM | POA: Diagnosis not present

## 2019-09-26 DIAGNOSIS — H02204 Unspecified lagophthalmos left upper eyelid: Secondary | ICD-10-CM | POA: Diagnosis not present

## 2019-09-26 DIAGNOSIS — Z0100 Encounter for examination of eyes and vision without abnormal findings: Secondary | ICD-10-CM | POA: Diagnosis not present

## 2019-09-26 DIAGNOSIS — H5200 Hypermetropia, unspecified eye: Secondary | ICD-10-CM | POA: Diagnosis not present

## 2019-09-26 DIAGNOSIS — H16213 Exposure keratoconjunctivitis, bilateral: Secondary | ICD-10-CM | POA: Diagnosis not present

## 2019-09-26 DIAGNOSIS — H02201 Unspecified lagophthalmos right upper eyelid: Secondary | ICD-10-CM | POA: Diagnosis not present

## 2019-10-29 DIAGNOSIS — M79671 Pain in right foot: Secondary | ICD-10-CM | POA: Diagnosis not present

## 2019-10-29 DIAGNOSIS — M779 Enthesopathy, unspecified: Secondary | ICD-10-CM | POA: Diagnosis not present

## 2019-11-19 DIAGNOSIS — M779 Enthesopathy, unspecified: Secondary | ICD-10-CM | POA: Diagnosis not present

## 2019-11-19 DIAGNOSIS — M79671 Pain in right foot: Secondary | ICD-10-CM | POA: Diagnosis not present

## 2019-11-25 DIAGNOSIS — R030 Elevated blood-pressure reading, without diagnosis of hypertension: Secondary | ICD-10-CM | POA: Diagnosis not present

## 2019-11-25 DIAGNOSIS — Z791 Long term (current) use of non-steroidal anti-inflammatories (NSAID): Secondary | ICD-10-CM | POA: Diagnosis not present

## 2019-11-25 DIAGNOSIS — Z809 Family history of malignant neoplasm, unspecified: Secondary | ICD-10-CM | POA: Diagnosis not present

## 2019-11-25 DIAGNOSIS — Z833 Family history of diabetes mellitus: Secondary | ICD-10-CM | POA: Diagnosis not present

## 2019-11-25 DIAGNOSIS — M1990 Unspecified osteoarthritis, unspecified site: Secondary | ICD-10-CM | POA: Diagnosis not present

## 2019-12-19 ENCOUNTER — Other Ambulatory Visit: Payer: Self-pay | Admitting: Family Medicine

## 2019-12-19 DIAGNOSIS — Z1231 Encounter for screening mammogram for malignant neoplasm of breast: Secondary | ICD-10-CM

## 2020-03-08 DIAGNOSIS — Z6820 Body mass index (BMI) 20.0-20.9, adult: Secondary | ICD-10-CM | POA: Diagnosis not present

## 2020-03-08 DIAGNOSIS — Z1231 Encounter for screening mammogram for malignant neoplasm of breast: Secondary | ICD-10-CM | POA: Diagnosis not present

## 2020-03-08 DIAGNOSIS — Z01419 Encounter for gynecological examination (general) (routine) without abnormal findings: Secondary | ICD-10-CM | POA: Diagnosis not present

## 2020-05-18 DIAGNOSIS — Z681 Body mass index (BMI) 19 or less, adult: Secondary | ICD-10-CM | POA: Diagnosis not present

## 2020-05-18 DIAGNOSIS — Z0000 Encounter for general adult medical examination without abnormal findings: Secondary | ICD-10-CM | POA: Diagnosis not present

## 2020-06-15 ENCOUNTER — Ambulatory Visit (INDEPENDENT_AMBULATORY_CARE_PROVIDER_SITE_OTHER): Payer: Medicare HMO | Admitting: Dermatology

## 2020-06-15 ENCOUNTER — Encounter: Payer: Self-pay | Admitting: Dermatology

## 2020-06-15 ENCOUNTER — Other Ambulatory Visit: Payer: Self-pay

## 2020-06-15 DIAGNOSIS — D692 Other nonthrombocytopenic purpura: Secondary | ICD-10-CM | POA: Diagnosis not present

## 2020-06-15 DIAGNOSIS — D489 Neoplasm of uncertain behavior, unspecified: Secondary | ICD-10-CM

## 2020-06-15 DIAGNOSIS — Z1283 Encounter for screening for malignant neoplasm of skin: Secondary | ICD-10-CM | POA: Diagnosis not present

## 2020-06-15 DIAGNOSIS — C44519 Basal cell carcinoma of skin of other part of trunk: Secondary | ICD-10-CM | POA: Diagnosis not present

## 2020-06-15 NOTE — Patient Instructions (Signed)

## 2020-06-21 ENCOUNTER — Telehealth: Payer: Self-pay | Admitting: *Deleted

## 2020-06-21 NOTE — Telephone Encounter (Signed)
-----   Message from [name], MD sent at 06/18/2020 10:54 AM EDT ----- Schedule surgery with Dr. Darene Lamer

## 2020-06-21 NOTE — Telephone Encounter (Signed)
Path to patient made surgery appointment with Dr.Tafeen.  

## 2020-06-30 DIAGNOSIS — R69 Illness, unspecified: Secondary | ICD-10-CM | POA: Diagnosis not present

## 2020-07-23 ENCOUNTER — Encounter: Payer: Self-pay | Admitting: Internal Medicine

## 2020-07-23 ENCOUNTER — Encounter: Payer: Self-pay | Admitting: Dermatology

## 2020-07-23 NOTE — Progress Notes (Signed)
   Follow-Up Visit   Subjective  Sydney Shaw is a 69 y.o. female who presents for the following: Annual Exam (skin check (no concerns)).  Growth Location: Chest Duration: Months Quality:  Associated Signs/Symptoms: Modifying Factors:  Severity:  Timing: Context: Would like general skin check.  Objective  Well appearing patient in no apparent distress; mood and affect are within normal limits.  All sun exposed areas plus back examined.  Plus arms and legs and upper chest.   Assessment & Plan    Neoplasm of uncertain behavior Chest - Medial (Center)  Skin / nail biopsy Type of biopsy: tangential   Informed consent: discussed and consent obtained   Timeout: patient name, date of birth, surgical site, and procedure verified   Anesthesia: the lesion was anesthetized in a standard fashion   Anesthetic:  1% lidocaine w/ epinephrine 1-100,000 local infiltration Instrument used: flexible razor blade   Hemostasis achieved with: ferric subsulfate   Outcome: patient tolerated procedure well   Post-procedure details: sterile dressing applied and wound care instructions given   Dressing type: bandage and petrolatum    Specimen 1 - Surgical pathology Differential Diagnosis: BCC SCC Check Margins: No  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  Skin exam for malignant neoplasm Mid Back  Annual skin examination      I, Sydney Monarch, MD, have reviewed all documentation for this visit.  The documentation on 07/23/20 for the exam, diagnosis, procedures, and orders are all accurate and complete.

## 2020-08-26 ENCOUNTER — Encounter: Payer: Medicare HMO | Admitting: Dermatology

## 2020-09-01 DIAGNOSIS — Z23 Encounter for immunization: Secondary | ICD-10-CM | POA: Diagnosis not present

## 2020-09-30 ENCOUNTER — Encounter: Payer: Self-pay | Admitting: Dermatology

## 2020-09-30 ENCOUNTER — Other Ambulatory Visit: Payer: Self-pay

## 2020-09-30 ENCOUNTER — Ambulatory Visit (INDEPENDENT_AMBULATORY_CARE_PROVIDER_SITE_OTHER): Payer: Medicare HMO | Admitting: Dermatology

## 2020-09-30 DIAGNOSIS — C4491 Basal cell carcinoma of skin, unspecified: Secondary | ICD-10-CM

## 2020-09-30 DIAGNOSIS — C44519 Basal cell carcinoma of skin of other part of trunk: Secondary | ICD-10-CM

## 2020-09-30 NOTE — Patient Instructions (Signed)

## 2020-10-03 ENCOUNTER — Encounter: Payer: Self-pay | Admitting: Dermatology

## 2020-10-03 NOTE — Progress Notes (Signed)
   Follow-Up Visit   Subjective  Sydney Shaw is a 70 y.o. female who presents for the following: Procedure (Bcc chest).  BCC Location: Chest Duration:  Quality:  Associated Signs/Symptoms: Modifying Factors:  Severity:  Timing: Context: For treatment  Objective  Well appearing patient in no apparent distress; mood and affect are within normal limits. Objective  Chest - Medial [facility]): Biopsy site identified by nurse and me.   A focused examination was performed including Head, neck, chest.. Relevant physical exam findings are noted in the Assessment and Plan.   Assessment & Plan    Basal cell carcinoma (BCC), unspecified site Chest - Medial (Center)  Destruction of lesion Complexity: simple   Destruction method: electrodesiccation and curettage   Informed consent: discussed and consent obtained   Timeout:  patient name, date of birth, surgical site, and procedure verified Anesthesia: the lesion was anesthetized in a standard fashion   Anesthetic:  1% lidocaine w/ epinephrine 1-100,000 local infiltration Curettage performed in three different directions: Yes   Curettage cycles:  3 Lesion length (cm):  1.4 Lesion width (cm):  1.4 Final wound size (cm):  1.4 Hemostasis achieved with:  ferric subsulfate Outcome: patient tolerated procedure well with no complications   Additional details:  Wound innoculated with 5 fluorouracil solution.     I, Sydney Monarch, MD, have reviewed all documentation for this visit.  The documentation on 10/03/20 for the exam, diagnosis, procedures, and orders are all accurate and complete.

## 2020-12-14 ENCOUNTER — Ambulatory Visit: Payer: Medicare HMO | Admitting: Dermatology

## 2021-02-28 ENCOUNTER — Other Ambulatory Visit: Payer: Self-pay

## 2021-02-28 ENCOUNTER — Ambulatory Visit: Payer: Medicare HMO | Admitting: Dermatology

## 2021-02-28 DIAGNOSIS — C44519 Basal cell carcinoma of skin of other part of trunk: Secondary | ICD-10-CM

## 2021-02-28 DIAGNOSIS — L570 Actinic keratosis: Secondary | ICD-10-CM

## 2021-02-28 DIAGNOSIS — C4491 Basal cell carcinoma of skin, unspecified: Secondary | ICD-10-CM

## 2021-02-28 DIAGNOSIS — D692 Other nonthrombocytopenic purpura: Secondary | ICD-10-CM

## 2021-03-05 ENCOUNTER — Encounter: Payer: Self-pay | Admitting: Dermatology

## 2021-03-05 NOTE — Progress Notes (Signed)
   Follow-Up Visit   Subjective  Sydney Shaw is a 70 y.o. female who presents for the following: Follow-up (Follow up [place] on chest. Patient said we were discussing the fluorouracil treatment for her chest. ).  Follow-up for spots on chest Location:  Duration:  Quality:  Associated Signs/Symptoms: Modifying Factors:  Severity:  Timing: Context:   Objective  Well appearing patient in no apparent distress; mood and affect are within normal limits. Chest - Medial [facility]) Scar is clear. No skin cancer.   Neck - Anterior Remarkable improvement in the number of ecchymoses on patient's chest.  Left Breast Patient still has a number of small actinic keratoses on her chest more than her face.  For now we will defer use of topical fluorouracil but this may still be a consideration in the winter.    A focused examination was performed including head, neck, chest.. Relevant physical exam findings are noted in the Assessment and Plan.   Assessment & Plan    Basal cell carcinoma (BCC), unspecified site Chest - Medial [facility])  Follow up on chest. Doing well no concerns. Yearly skin exam.    Solar purpura ([place]) Neck - Anterior  Uncertain if improvement is spontaneous or related to using Dermend.  No plans for change in treatment.  AK (actinic keratosis) Left Breast  I have asked patient to contact me in the winter if there are still crusted areas on her face and she would like to try topical therapy.  Otherwise routine follow-up in 1 year      I, [name], MD, have reviewed all documentation for this visit.  The documentation on 03/05/21 for the exam, diagnosis, procedures, and orders are all accurate and complete.

## 2021-05-05 ENCOUNTER — Other Ambulatory Visit: Payer: Self-pay

## 2021-05-05 DIAGNOSIS — I83892 Varicose veins of left lower extremities with other complications: Secondary | ICD-10-CM

## 2021-05-17 ENCOUNTER — Ambulatory Visit (HOSPITAL_COMMUNITY)
Admission: RE | Admit: 2021-05-17 | Discharge: 2021-05-17 | Disposition: A | Discharge status: Home / Self Care | Payer: Medicare HMO | Source: Ambulatory Visit | Attending: Vascular Surgery | Admitting: Vascular Surgery

## 2021-05-17 ENCOUNTER — Other Ambulatory Visit: Payer: Self-pay

## 2021-05-17 ENCOUNTER — Ambulatory Visit (INDEPENDENT_AMBULATORY_CARE_PROVIDER_SITE_OTHER): Payer: Medicare HMO | Admitting: Physician Assistant

## 2021-05-17 VITALS — BP 146/73 | HR 68 | Temp 97.6°F | Resp 12 | Ht 64.0 in | Wt 109.1 lb

## 2021-05-17 DIAGNOSIS — I83892 Varicose veins of left lower extremities with other complications: Secondary | ICD-10-CM

## 2021-05-17 DIAGNOSIS — I83899 Varicose veins of unspecified lower extremities with other complications: Secondary | ICD-10-CM | POA: Diagnosis not present

## 2021-05-17 NOTE — Progress Notes (Signed)
Requested by:  Caryl Bis, MD [place],  [place] [address]  Reason for consultation: LLE spider veins    History of Present Illness   Sydney Shaw is a 70 y.o. (1951-02-20) female who presents for evaluation of left lower extremity spider veins with irritation. She has had visible veins for some years, but now has an intermittently painful patch of reticular veins behind her left knee.  She has no prior history of DVT.  She states she underwent left lower extremity "vein stripping" in the distant past.  No history of nonhealing ulcers.  She is retired now but when working full-time she wore compression stockings during the day.  She complains of mild edema worse at night.  Venous symptoms include: positive if (X) [  ] aching [  ] heavy [  ] tired  [  ] throbbing [ s ] burning  [  ] itching [ x ]swelling [  ] bleeding [  ] ulcer  Onset/duration:  years  Occupation:  retired Aggravating factors: standing Alleviating factors: elevation>recliner Compression:  yes Helps:  yes Pain medications:  no Previous vein procedures:  yes History of DVT:  no  Past Medical History:  Diagnosis Date   Medical history non-contributory    Squamous cell carcinoma of skin 07/30/2018   "V" of neck - CX3 + excision   Squamous cell carcinoma of skin 07/30/2018   right shin - CX3 + 5FU   Superficial basal cell carcinoma 07/30/2018   left breast - CX3 + 5FU   Superficial basal cell carcinoma 06/15/2020   nod-chest-medial(center) (CX35FU)   Superficial basal cell carcinoma (BCC) 05/02/2016   left upperback - CX3 + 5FU    Past Surgical History:  Procedure Laterality Date   BREAST ENHANCEMENT SURGERY     COLONOSCOPY     COLONOSCOPY N/A 07/16/2015   Procedure: COLONOSCOPY;  Surgeon: Daneil Dolin, MD;  [facility];  Service: Endoscopy;  Laterality: N/A;  10:45 Am    Social History   Socioeconomic History   Marital status: Married    Spouse name: Not on file    Number of children: Not on file   Years of education: Not on file   Highest education level: Not on file  Occupational History   Not on file  Tobacco Use   Smoking status: Never   Smokeless tobacco: Never  Vaping Use   Vaping Use: Never used  Substance and Sexual Activity   Alcohol use: No   Drug use: No   Sexual activity: Not on file  Other Topics Concern   Not on file  Social History Narrative   Not on file   Social Determinants of Health   Financial Resource Strain: Not on file  Food Insecurity: Not on file  Transportation Needs: Not on file  Physical Activity: Not on file  Stress: Not on file  Social Connections: Not on file  Intimate Partner Violence: Not on file   No family history on file.  Current Outpatient Medications  Medication Sig Dispense Refill   Biotin 1000 MCG tablet Take 1,000 mcg by mouth daily.     Multiple Vitamin (MULTIVITAMIN) capsule Take 1 capsule by mouth daily.     NON FORMULARY Take 1 tablet by mouth daily. Chewable Calcium     Not sure strength   One daily     NON FORMULARY Take 1 capsule by mouth daily. Probiotic   daily  gabapentin (NEURONTIN) 300 MG capsule Take 300 mg by mouth daily. (Patient not taking: Reported on 05/17/2021)     NON FORMULARY Iron  27 mg daily (Patient not taking: Reported on 05/17/2021)     No current facility-administered medications for this visit.    No Known Allergies  REVIEW OF SYSTEMS (negative unless checked):   Cardiac:  '[]'$  Chest pain or chest pressure? '[]'$  Shortness of breath upon activity? '[]'$  Shortness of breath when lying flat? '[]'$  Irregular heart rhythm?  Vascular:  '[]'$  Pain in calf, thigh, or hip brought on by walking? '[]'$  Pain in feet at night that wakes you up from your sleep? '[]'$  Blood clot in your veins? '[x]'$  Leg swelling?  Pulmonary:  '[]'$  Oxygen at home? '[]'$  Productive cough? '[]'$  Wheezing?  Neurologic:  '[]'$  Sudden weakness in arms or legs? '[]'$  Sudden numbness in arms or legs? '[]'$  Sudden onset  of difficult speaking or slurred speech? '[]'$  Temporary loss of vision in one eye? '[]'$  Problems with dizziness?  Gastrointestinal:  '[]'$  Blood in stool? '[]'$  Vomited blood?  Genitourinary:  '[]'$  Burning when urinating? '[]'$  Blood in urine?  Psychiatric:  '[]'$  Major depression  Hematologic:  '[]'$  Bleeding problems? '[]'$  Problems with blood clotting?  Dermatologic:  '[]'$  Rashes or ulcers?  Constitutional:  '[]'$  Fever or chills?  Ear/Nose/Throat:  '[]'$  Change in hearing? '[]'$  Nose bleeds? '[]'$  Sore throat?  Musculoskeletal:  '[]'$  Back pain? '[]'$  Joint pain? '[]'$  Muscle pain?   Physical Examination     Vitals:   05/17/21 1055  BP: (!) 146/73  Pulse: 68  Resp: 12  Temp: 97.6 F (36.4 C)  TempSrc: Temporal  SpO2: 99%  Weight: 109 lb 1.6 oz (49.5 kg)  Height: '5\' 4"'$  (1.626 m)   Body mass index is 18.73 kg/m.  General:  WDWN in NAD; vital signs documented above Gait: normal, no ataxia HENT: WNL, normocephalic Pulmonary: normal non-labored breathing , without Rales, rhonchi,  wheezing Cardiac: regular HR, without  Murmurs without carotid bruits Skin: without rashes Vascular Exam/Pulses: 2+ dorsalis pedis, posterior tibial pulses bilaterally Extremities: with varicose veins, with reticular veins, without edema, without stasis pigmentation, without lipodermatosclerosis, without ulcers Musculoskeletal: no muscle wasting or atrophy  Neurologic: A&O X 3;  No focal weakness or paresthesias are detected Psychiatric:  The pt has Normal affect.  Right leg   Left leg   Left leg   Non-invasive Vascular Imaging   LLE Venous Insufficiency Duplex  Summary:  Left:  - No evidence of deep vein thrombosis seen in the left lower extremity,  from the common femoral through the popliteal veins.  - There is no evidence of venous reflux seen in the left lower extremity.  - Varicose veins visualized at the proximal calf     *See table(s) above for measurements and observations.     Preliminary     Medical Decision Making   Sydney Shaw is a 70 y.o. female who presents with: Intermittently painful left lower extremity reticular veins.  She has reticular veins of both lower extremities and several small superficial varicosities.  No evidence of deep venous thrombosis or reflux. Based on the patient's history and examination, I recommend: Healthy vein measures including elevation in appropriate position.  She is provided with printed information and instructions. I discussed with the patient the use of her 15 to 20 mmHg knee high compression stockings  I informed her of the minimal out-of-pocket expenses for sclerotherapy and to contact our office to arrange an appointment with sclerotherapy  are in to discuss treatment. Thank you for allowing [place] to participate in this patient's care.   Barbie Banner, PA-C [facility] Office: [phone]  05/17/2021, 11:11 AM  Clinic MD: Dr. Stanford Breed

## 2021-08-29 ENCOUNTER — Ambulatory Visit: Payer: Medicare HMO | Admitting: Dermatology

## 2021-11-29 ENCOUNTER — Encounter: Payer: Self-pay | Admitting: Internal Medicine

## 2022-01-18 ENCOUNTER — Ambulatory Visit: Payer: Medicare HMO | Admitting: Dermatology

## 2022-01-18 ENCOUNTER — Encounter: Payer: Self-pay | Admitting: Dermatology

## 2022-01-18 DIAGNOSIS — Z1283 Encounter for screening for malignant neoplasm of skin: Secondary | ICD-10-CM

## 2022-01-18 DIAGNOSIS — Z85828 Personal history of other malignant neoplasm of skin: Secondary | ICD-10-CM

## 2022-01-18 DIAGNOSIS — L821 Other seborrheic keratosis: Secondary | ICD-10-CM | POA: Diagnosis not present

## 2022-01-18 DIAGNOSIS — L570 Actinic keratosis: Secondary | ICD-10-CM

## 2022-01-18 MED ORDER — TRETINOIN 0.025 % EX CREA: TOPICAL_CREAM | Freq: Every day | CUTANEOUS | 4 refills | Status: AC

## 2022-01-19 ENCOUNTER — Telehealth: Payer: Self-pay | Admitting: *Deleted

## 2022-01-19 NOTE — Telephone Encounter (Signed)
Called left message for patient to call us back- using tretinoin for Ak's and insurance wont cover- cash pay.  ?

## 2022-01-25 ENCOUNTER — Ambulatory Visit: Payer: Medicare HMO

## 2022-02-04 ENCOUNTER — Encounter: Payer: Self-pay | Admitting: Dermatology

## 2022-02-04 NOTE — Progress Notes (Signed)
   Follow-Up Visit   Subjective  Sydney Shaw is a 71 y.o. female who presents for the following: Annual Exam (Maybe a back spot on back- cant tell).  Annual skin check, follow-up skin cancer chest Location:  Duration:  Quality:  Associated Signs/Symptoms: Modifying Factors:  Severity:  Timing: Context:   Objective  Well appearing patient in no apparent distress; mood and affect are within normal limits. Full body skin examination   4 to 6 mm flattopped brown textured papules, compatible dermoscopy  Left Breast No sign of recurrent cancer but there are still several small gritty crusts of actinic keratoses.    A full examination was performed including scalp, head, eyes, ears, nose, lips, neck, chest, axillae, abdomen, back, buttocks, bilateral upper extremities, bilateral lower extremities, hands, feet, fingers, toes, fingernails, and toenails. All findings within normal limits unless otherwise noted below.  Is beneath undergarments not fully examined.   Assessment & Plan    Encounter for screening for malignant neoplasm of skin  Seborrheic keratosis  Leave if stable  AK (actinic keratosis) (2) Chest - Medial (Center); Anterior Mid Neck  PDT this winter  Destruction of lesion - Anterior Mid Neck, Chest - Medial (Center) Complexity: simple   Destruction method: cryotherapy   Informed consent: discussed and consent obtained   Timeout:  patient name, date of birth, surgical site, and procedure verified Lesion destroyed using liquid nitrogen: Yes   Cryotherapy cycles:  3 Outcome: patient tolerated procedure well with no complications    Related Medications tretinoin (RETIN-A) 0.025 % cream Apply topically at bedtime.  Personal history of skin cancer Left Breast  Check as needed change.  May try tretinoin on the chest area every 1-2 nights to try and minimize future spots.      I, [name], MD, have reviewed all documentation for this visit.  The  documentation on 02/04/22 for the exam, diagnosis, procedures, and orders are all accurate and complete.

## 2022-03-02 ENCOUNTER — Ambulatory Visit: Payer: Medicare HMO | Admitting: Gastroenterology

## 2022-04-27 ENCOUNTER — Other Ambulatory Visit: Payer: Self-pay | Admitting: Obstetrics & Gynecology

## 2022-04-27 DIAGNOSIS — Z1231 Encounter for screening mammogram for malignant neoplasm of breast: Secondary | ICD-10-CM

## 2022-05-12 ENCOUNTER — Ambulatory Visit: Payer: Medicare HMO

## 2022-07-25 ENCOUNTER — Ambulatory Visit: Payer: Medicare HMO | Admitting: Dermatology

## 2022-09-29 ENCOUNTER — Emergency Department: Admission: EM | Admit: 2022-09-29 | Discharge: 2022-09-29 | Discharge status: Absent without leave | Payer: Medicare HMO

## 2023-12-17 ENCOUNTER — Other Ambulatory Visit: Payer: Self-pay | Admitting: Obstetrics & Gynecology

## 2023-12-17 DIAGNOSIS — N644 Mastodynia: Secondary | ICD-10-CM

## 2024-01-22 ENCOUNTER — Ambulatory Visit: Admitting: Gastroenterology

## 2024-01-22 NOTE — Progress Notes (Deleted)
 GI Office Note    Referring Provider: Leesa Pulling, MD Primary Care Physician:  Leesa Pulling, MD  Primary Gastroenterologist:  Chief Complaint   No chief complaint on file.    History of Present Illness   Sydney Shaw is a 73 y.o. female presenting today      Colonoscopy 07/2015: -multiple colonic polyps removed, tubular adenomas -repeat colonoscopy in five years   Medications   Current Outpatient Medications  Medication Sig Dispense Refill   Biotin 1000 MCG tablet Take 1,000 mcg by mouth daily.     gabapentin (NEURONTIN) 300 MG capsule Take 300 mg by mouth daily. (Patient not taking: Reported on 05/17/2021)     Multiple Vitamin (MULTIVITAMIN) capsule Take 1 capsule by mouth daily.     NON FORMULARY Iron  27 mg daily (Patient not taking: Reported on 05/17/2021)     NON FORMULARY Take 1 tablet by mouth daily. Chewable Calcium     Not sure strength   One daily     NON FORMULARY Take 1 capsule by mouth daily. Probiotic   daily     No current facility-administered medications for this visit.    Allergies   Allergies as of 01/22/2024   (No Known Allergies)    Past Medical History   Past Medical History:  Diagnosis Date   Medical history non-contributory    Squamous cell carcinoma of skin 07/30/2018   "V" of neck - CX3 + excision   Squamous cell carcinoma of skin 07/30/2018   right shin - CX3 + 5FU   Superficial basal cell carcinoma 07/30/2018   left breast - CX3 + 5FU   Superficial basal cell carcinoma 06/15/2020   nod-chest-medial(center) (CX35FU)   Superficial basal cell carcinoma (BCC) 05/02/2016   left upperback - CX3 + 5FU    Past Surgical History   Past Surgical History:  Procedure Laterality Date   BREAST ENHANCEMENT SURGERY     COLONOSCOPY     COLONOSCOPY N/A 07/16/2015   Procedure: COLONOSCOPY;  Surgeon: Suzette Espy, MD;  [facility];  Service: Endoscopy;  Laterality: N/A;  10:45 Am    Past Family History   No  family history on file.  Past Social History   Social History   Socioeconomic History   Marital status: Married    Spouse name: Not on file   Number of children: Not on file   Years of education: Not on file   Highest education level: Not on file  Occupational History   Not on file  Tobacco Use   Smoking status: Never   Smokeless tobacco: Never  Vaping Use   Vaping status: Never Used  Substance and Sexual Activity   Alcohol use: No   Drug use: No   Sexual activity: Not on file  Other Topics Concern   Not on file  Social History Narrative   Not on file   Social Drivers of Health   Financial Resource Strain: Not on file  Food Insecurity: Not on file  Transportation Needs: Not on file  Physical Activity: Not on file  Stress: Not on file  Social Connections: Not on file  Intimate Partner Violence: Not on file    Review of Systems   General: Negative for anorexia, weight loss, fever, chills, fatigue, weakness. Eyes: Negative for vision changes.  ENT: Negative for hoarseness, difficulty swallowing , nasal congestion. CV: Negative for chest pain, angina, palpitations, dyspnea on exertion, peripheral edema.  Respiratory: Negative for  dyspnea at rest, dyspnea on exertion, cough, sputum, wheezing.  GI: See history of present illness. GU:  Negative for dysuria, hematuria, urinary incontinence, urinary frequency, nocturnal urination.  MS: Negative for joint pain, low back pain.  Derm: Negative for rash or itching.  Neuro: Negative for weakness, abnormal sensation, seizure, frequent headaches, memory loss,  confusion.  Psych: Negative for anxiety, depression, suicidal ideation, hallucinations.  Endo: Negative for unusual weight change.  Heme: Negative for bruising or bleeding. Allergy: Negative for rash or hives.  Physical Exam   There were no vitals taken for this visit.   General: Well-nourished, well-developed in no acute distress.  Head: Normocephalic, atraumatic.    Eyes: Conjunctiva pink, no icterus. Mouth: Oropharyngeal mucosa moist and pink , no lesions erythema or exudate. Neck: Supple without thyromegaly, masses, or lymphadenopathy.  Lungs: Clear to auscultation bilaterally.  Heart: Regular rate and rhythm, no murmurs rubs or gallops.  Abdomen: Bowel sounds are normal, nontender, nondistended, no hepatosplenomegaly or masses,  no abdominal bruits or hernia, no rebound or guarding.   Rectal: *** Extremities: No lower extremity edema. No clubbing or deformities.  Neuro: Alert and oriented x 4 , grossly normal neurologically.  Skin: Warm and dry, no rash or jaundice.   Psych: Alert and cooperative, normal mood and affect.  Labs   *** Imaging Studies   No results found.  Assessment       PLAN   ***   Trudie Fuse. Harles Lied, MHS, PA-C [facility]

## 2024-01-29 ENCOUNTER — Encounter: Payer: Self-pay | Admitting: Gastroenterology

## 2024-01-29 ENCOUNTER — Ambulatory Visit: Admitting: Gastroenterology

## 2024-01-29 ENCOUNTER — Telehealth: Payer: Self-pay | Admitting: *Deleted

## 2024-01-29 VITALS — BP 150/76 | HR 83 | Temp 97.9°F | Ht 64.0 in | Wt 105.0 lb

## 2024-01-29 DIAGNOSIS — R194 Change in bowel habit: Secondary | ICD-10-CM

## 2024-01-29 DIAGNOSIS — Z860101 Personal history of adenomatous and serrated colon polyps: Secondary | ICD-10-CM | POA: Insufficient documentation

## 2024-01-29 DIAGNOSIS — I728 Aneurysm of other specified arteries: Secondary | ICD-10-CM | POA: Diagnosis not present

## 2024-01-29 DIAGNOSIS — R1013 Epigastric pain: Secondary | ICD-10-CM

## 2024-01-29 DIAGNOSIS — R911 Solitary pulmonary nodule: Secondary | ICD-10-CM

## 2024-01-29 DIAGNOSIS — R109 Unspecified abdominal pain: Secondary | ICD-10-CM | POA: Insufficient documentation

## 2024-01-29 DIAGNOSIS — R198 Other specified symptoms and signs involving the digestive system and abdomen: Secondary | ICD-10-CM | POA: Insufficient documentation

## 2024-01-29 NOTE — Progress Notes (Signed)
 GI Office Note    Referring Provider: Leesa Pulling, MD Primary Care Physician:  Leesa Pulling, MD  Primary Gastroenterologist: Rheba Cedar, MD   Chief Complaint   Chief Complaint  Patient presents with   Abdominal Pain    Has issues with abdominal pain/cramping. Also states that she does not have good bm's. Only produces small balls of stool.      History of Present Illness   Sydney Shaw is a 73 y.o. female presenting today for abdominal pain, issues with bowels. Received referral in 2023, patient was never seen. She has history of multiple tubular adenomas, was due for colonoscopy in 2021 but did not respond to reminder letter.    Two months of postprandial stools starts in epigastric pain and runs down left side. Can happen every time she eats. Can happen first thing in the morning before meals. Not associated with BM. No N/V. At times will limit oral intake because of the pain. No heartburn. No dysphagia. No weight loss. Abdominal pain does not get better with BM. Change in bowels, stools are small soft stool marbles. Has 3-4 stools every day. No melena, brbpr. Still very active. Works out three days a week. Walks daily.    Last physical 2023. Cholesterol little high. Medication caused indigestion. Tried cutting back to every other day but still with heartburn so she stopped it. She started fish oil. No ASA, NSAIDs.  History of pulmonary nodules on CT in 2023. Had two CTs. Was told likely due to infection. She has not had follow up. She has FH of lung cancer in multiple first degree relatives although they had been smokers. Patient never smoked.  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  Last colonoscpoy 2016, multiple tubular adenomas removed. Was due for surveillance exam in 2021.    Medications   Current Outpatient Medications  Medication Sig Dispense Refill   Multiple Vitamin (MULTIVITAMIN) capsule Take 1 capsule by mouth daily.     NON FORMULARY Take 1  capsule by mouth daily. Probiotic   daily     Omega-3 Fatty Acids (FISH OIL BURP-LESS PO) Take by mouth.     No current facility-administered medications for this visit.    Allergies   Allergies as of 01/29/2024   (No Known Allergies)    Past Medical History   Past Medical History:  Diagnosis Date   Medical history non-contributory    Squamous cell carcinoma of skin 07/30/2018   "V" of neck - CX3 + excision   Squamous cell carcinoma of skin 07/30/2018   right shin - CX3 + 5FU   Superficial basal cell carcinoma 07/30/2018   left breast - CX3 + 5FU   Superficial basal cell carcinoma 06/15/2020   nod-chest-medial(center) (CX35FU)   Superficial basal cell carcinoma (BCC) 05/02/2016   left upperback - CX3 + 5FU    Past Surgical History   Past Surgical History:  Procedure Laterality Date   BREAST ENHANCEMENT SURGERY     COLONOSCOPY     COLONOSCOPY N/A 07/16/2015   Procedure: COLONOSCOPY;  Surgeon: Suzette Espy, MD;  [facility];  Service: Endoscopy;  Laterality: N/A;  10:45 Am    Past Family History   Family History  Problem Relation Age of Onset   Lung cancer Mother    Lung cancer Sister    Lung cancer Sister    Colon cancer Neg Hx    Inflammatory bowel disease Neg Hx    Celiac disease  Neg Hx     Past Social History   Social History   Socioeconomic History   Marital status: Married    Spouse name: Not on file   Number of children: Not on file   Years of education: Not on file   Highest education level: Not on file  Occupational History   Not on file  Tobacco Use   Smoking status: Never   Smokeless tobacco: Never  Vaping Use   Vaping status: Never Used  Substance and Sexual Activity   Alcohol use: Never   Drug use: Never   Sexual activity: Not Currently  Other Topics Concern   Not on file  Social History Narrative   Not on file   Social Drivers of Health   Financial Resource Strain: Not on file  Food Insecurity: Not on file   Transportation Needs: Not on file  Physical Activity: Not on file  Stress: Not on file  Social Connections: Not on file  Intimate Partner Violence: Not on file    Review of Systems   General: Negative for anorexia, weight loss, fever, chills, fatigue, weakness. Eyes: Negative for vision changes.  ENT: Negative for hoarseness, difficulty swallowing , nasal congestion. CV: Negative for chest pain, angina, palpitations, dyspnea on exertion, peripheral edema.  Respiratory: Negative for dyspnea at rest, dyspnea on exertion, cough, sputum, wheezing.  GI: See history of present illness. GU:  Negative for dysuria, hematuria, urinary incontinence, urinary frequency, nocturnal urination.  MS: Negative for joint pain, low back pain.  Derm: Negative for rash or itching.  Neuro: Negative for weakness, abnormal sensation, seizure, frequent headaches, memory loss,  confusion.  Psych: Negative for anxiety, depression, suicidal ideation, hallucinations.  Endo: Negative for unusual weight change.  Heme: Negative for bruising or bleeding. Allergy: Negative for rash or hives.  Physical Exam   BP (!) 150/76 (BP Location: Right Arm, Patient Position: Sitting, Cuff Size: Normal)   Pulse 83   Temp 97.9 F (36.6 C) (Oral)   Ht 5\' 4"  (1.626 m)   Wt 105 lb (47.6 kg)   SpO2 100%   BMI 18.02 kg/m    General: Well-nourished, well-developed in no acute distress.  Head: Normocephalic, atraumatic.   Eyes: Conjunctiva pink, no icterus. Mouth: Oropharyngeal mucosa moist and pink   Neck: Supple without thyromegaly, masses, or lymphadenopathy.  Lungs: Clear to auscultation bilaterally.  Heart: Regular rate and rhythm, no murmurs rubs or gallops.  Abdomen: Bowel sounds are normal, nontender, nondistended, no hepatosplenomegaly or masses,  no abdominal bruits or hernia, no rebound or guarding.   Rectal: not performed Extremities: No lower extremity edema. No clubbing or deformities.  Neuro: Alert and  oriented x 4 , grossly normal neurologically.  Skin: Warm and dry, no rash or jaundice.   Psych: Alert and cooperative, normal mood and affect.  Labs   None available Imaging Studies   No results found.  Assessment/Plan:   Abdominal pain: postprandial pain starts in epigastric region and radiates downward into abdomen, predominantly the left side. Not associated with BMs. No weight loss. No n/v. May restrict oral intake due to pain. Etiology not clear. Could be a number of possibilities.  -CT A/P with contrast -CBC, CMET, lipase -if CT negative, she will need EGD.   Change in bowels: overdue for surveillance colonoscopy with history of tubular adenomas removed in 2016.  -await CT findings -colonoscopy in near future. Consider small volume prep.  Splenic artery aneurysm:  -await CT findings  Pulmonary nodules: -encouraged patient to  speak with PCP regarding possibility of surveillance CT, in light of FH of lung cancer    Trudie Fuse. Harles Lied, MHS, PA-C [facility]

## 2024-01-29 NOTE — Telephone Encounter (Signed)
 CPT Code: 13244 Description: CT ABDOMEN & PELVIS W/ Authorization Number: [id number] Case Number: [id number] Review Date: 01/29/2024 1:51:29 PM Expiration Date: 07/27/2024 Status: Your case has been Approved. -----

## 2024-01-29 NOTE — Patient Instructions (Addendum)
 Please complete labs at [facility], [address] in [place]. CT scan of your abdomen to be scheduled.  Discuss with your PCP regarding considering follow up CT chest for pulmonary nodules in light of your family history of lung cancer.

## 2024-01-29 NOTE — Telephone Encounter (Signed)
LMOVM to call back to give CT appt details

## 2024-01-31 NOTE — Telephone Encounter (Signed)
 Called pt and she stated she was already aware of CT appt details

## 2024-02-12 ENCOUNTER — Ambulatory Visit (HOSPITAL_COMMUNITY)
Admission: RE | Admit: 2024-02-12 | Discharge: 2024-02-12 | Disposition: A | Discharge status: Home / Self Care | Source: Ambulatory Visit | Attending: Gastroenterology | Admitting: Gastroenterology

## 2024-02-12 ENCOUNTER — Encounter (HOSPITAL_COMMUNITY): Payer: Self-pay | Admitting: Radiology

## 2024-02-12 ENCOUNTER — Other Ambulatory Visit (HOSPITAL_COMMUNITY)
Admission: RE | Admit: 2024-02-12 | Discharge: 2024-02-12 | Disposition: A | Discharge status: Home / Self Care | Source: Ambulatory Visit | Attending: Gastroenterology | Admitting: Gastroenterology

## 2024-02-12 DIAGNOSIS — R1013 Epigastric pain: Secondary | ICD-10-CM | POA: Diagnosis present

## 2024-02-12 DIAGNOSIS — Z860101 Personal history of adenomatous and serrated colon polyps: Secondary | ICD-10-CM | POA: Insufficient documentation

## 2024-02-12 DIAGNOSIS — I728 Aneurysm of other specified arteries: Secondary | ICD-10-CM | POA: Insufficient documentation

## 2024-02-12 DIAGNOSIS — R198 Other specified symptoms and signs involving the digestive system and abdomen: Secondary | ICD-10-CM | POA: Diagnosis present

## 2024-02-12 DIAGNOSIS — R109 Unspecified abdominal pain: Secondary | ICD-10-CM | POA: Diagnosis present

## 2024-02-12 LAB — CBC WITH DIFFERENTIAL/PLATELET
Abs Immature Granulocytes: 0.02 10*3/uL (ref 0.00–0.07)
Basophils Absolute: 0 10*3/uL (ref 0.0–0.1)
Basophils Relative: 0 %
Eosinophils Absolute: 0.1 10*3/uL (ref 0.0–0.5)
Eosinophils Relative: 1 %
HCT: 38.3 % (ref 36.0–46.0)
Hemoglobin: 13 g/dL (ref 12.0–15.0)
Immature Granulocytes: 0 %
Lymphocytes Relative: 28 %
Lymphs Abs: 2.3 10*3/uL (ref 0.7–4.0)
MCH: 31.9 pg (ref 26.0–34.0)
MCHC: 33.9 g/dL (ref 30.0–36.0)
MCV: 94.1 fL (ref 80.0–100.0)
Monocytes Absolute: 0.7 10*3/uL (ref 0.1–1.0)
Monocytes Relative: 8 %
Neutro Abs: 5.1 10*3/uL (ref 1.7–7.7)
Neutrophils Relative %: 63 %
Platelets: 384 10*3/uL (ref 150–400)
RBC: 4.07 MIL/uL (ref 3.87–5.11)
RDW: 12.2 % (ref 11.5–15.5)
WBC: 8.2 10*3/uL (ref 4.0–10.5)
nRBC: 0 % (ref 0.0–0.2)

## 2024-02-12 LAB — COMPREHENSIVE METABOLIC PANEL WITH GFR
ALT: 13 U/L (ref 0–44)
AST: 21 U/L (ref 15–41)
Albumin: 3.9 g/dL (ref 3.5–5.0)
Alkaline Phosphatase: 55 U/L (ref 38–126)
Anion gap: 9 (ref 5–15)
BUN: 10 mg/dL (ref 8–23)
CO2: 24 mmol/L (ref 22–32)
Calcium: 9.2 mg/dL (ref 8.9–10.3)
Chloride: 96 mmol/L — ABNORMAL LOW (ref 98–111)
Creatinine, Ser: 0.58 mg/dL (ref 0.44–1.00)
GFR, Estimated: >60 mL/min (ref >60)
Glucose, Bld: 98 mg/dL (ref 70–99)
Potassium: 4.6 mmol/L (ref 3.5–5.1)
Sodium: 129 mmol/L — ABNORMAL LOW (ref 135–145)
Total Bilirubin: 0.8 mg/dL (ref 0.0–1.2)
Total Protein: 7.4 g/dL (ref 6.5–8.1)

## 2024-02-12 LAB — LIPASE, BLOOD: Lipase: 35 U/L (ref 11–51)

## 2024-02-12 MED ORDER — IOHEXOL 300 MG/ML  SOLN
100.0000 mL | Freq: Once | INTRAMUSCULAR | Status: AC | PRN
  Administered 2024-02-12: 100 mL via INTRAVENOUS

## 2024-02-18 ENCOUNTER — Telehealth: Payer: Self-pay | Admitting: *Deleted

## 2024-02-18 NOTE — Telephone Encounter (Signed)
 Pt called and states she is ready to be scheduled for TCS/EGD. Please advise. Thank you

## 2024-02-19 ENCOUNTER — Other Ambulatory Visit: Payer: Self-pay

## 2024-02-19 ENCOUNTER — Ambulatory Visit: Payer: Self-pay | Admitting: Gastroenterology

## 2024-02-19 DIAGNOSIS — E871 Hypo-osmolality and hyponatremia: Secondary | ICD-10-CM

## 2024-02-19 NOTE — Telephone Encounter (Signed)
 Pt was made aware and verbalized understanding. Pt states that she drinks 2 16oz bottles a day of water .   Mandy: please send copy of CT and labs to PCP

## 2024-02-19 NOTE — Telephone Encounter (Signed)
 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 Stable since 2023, only 1mm change in size since 2008.   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    Let her know her labs showed:  -No anemia -Normal kidney function -Normal liver labs -Her sodium is low at 129. Not sure why, she is not on any medication that would lower it.  -Before we give her a bowel prep, this needs to be addressed.   -->PLEASE LET HER KNOW RESULTS -->SHE SHOULD FOLLOW WITH PCP FOR SPLENIC ARTERY ANEURYSM ADVISE, LOOKS STABLE BUT MIGHT BE BENEFICIAL TO SEE VASCULAR SPECIALIST -->HER SODIUM IS LOW, WE NEED TO REPEAT NOW (CHECK BMET) -->FIND OUT HOW MUCH WATER  SHE DRINKS PER DAY -->SEND COPY OF CT AND LABS TO PCP

## 2024-02-20 ENCOUNTER — Ambulatory Visit
Admission: RE | Admit: 2024-02-20 | Discharge: 2024-02-20 | Disposition: A | Discharge status: Home / Self Care | Source: Ambulatory Visit | Attending: Obstetrics & Gynecology | Admitting: Obstetrics & Gynecology

## 2024-02-20 ENCOUNTER — Other Ambulatory Visit: Payer: Self-pay | Admitting: Obstetrics & Gynecology

## 2024-02-20 DIAGNOSIS — N644 Mastodynia: Secondary | ICD-10-CM

## 2024-02-20 DIAGNOSIS — N6489 Other specified disorders of breast: Secondary | ICD-10-CM

## 2024-02-29 ENCOUNTER — Ambulatory Visit: Payer: Self-pay | Admitting: Gastroenterology

## 2024-02-29 LAB — BASIC METABOLIC PANEL WITH GFR
BUN/Creatinine Ratio: 17 (ref 12–28)
BUN: 11 mg/dL (ref 8–27)
CO2: 20 mmol/L (ref 20–29)
Calcium: 9.9 mg/dL (ref 8.7–10.3)
Chloride: 101 mmol/L (ref 96–106)
Creatinine, Ser: 0.66 mg/dL (ref 0.57–1.00)
Glucose: 92 mg/dL (ref 70–99)
Potassium: 5 mmol/L (ref 3.5–5.2)
Sodium: 137 mmol/L (ref 134–144)
eGFR: 93 mL/min/{1.73_m2} (ref >59)

## 2024-03-03 ENCOUNTER — Other Ambulatory Visit: Payer: Self-pay | Admitting: *Deleted

## 2024-03-03 ENCOUNTER — Encounter: Payer: Self-pay | Admitting: *Deleted

## 2024-03-03 MED ORDER — NA SULFATE-K SULFATE-MG SULF 17.5-3.13-1.6 GM/177ML PO SOLN: ORAL | 0 refills | Status: DC

## 2024-03-03 NOTE — Telephone Encounter (Signed)
 Pt has been scheduled for 04/03/24. Instructions mailed and prep sent to the pharmacy.

## 2024-03-10 ENCOUNTER — Encounter: Payer: Self-pay | Admitting: Physician Assistant

## 2024-03-10 ENCOUNTER — Ambulatory Visit: Admitting: Physician Assistant

## 2024-03-10 VITALS — BP 125/74

## 2024-03-10 DIAGNOSIS — D492 Neoplasm of unspecified behavior of bone, soft tissue, and skin: Secondary | ICD-10-CM | POA: Diagnosis not present

## 2024-03-10 DIAGNOSIS — L111 Transient acantholytic dermatosis [Grover]: Secondary | ICD-10-CM

## 2024-03-10 DIAGNOSIS — W908XXA Exposure to other nonionizing radiation, initial encounter: Secondary | ICD-10-CM | POA: Diagnosis not present

## 2024-03-10 DIAGNOSIS — S40861A Insect bite (nonvenomous) of right upper arm, initial encounter: Secondary | ICD-10-CM

## 2024-03-10 DIAGNOSIS — C44519 Basal cell carcinoma of skin of other part of trunk: Secondary | ICD-10-CM | POA: Diagnosis not present

## 2024-03-10 DIAGNOSIS — D1801 Hemangioma of skin and subcutaneous tissue: Secondary | ICD-10-CM

## 2024-03-10 DIAGNOSIS — C4491 Basal cell carcinoma of skin, unspecified: Secondary | ICD-10-CM

## 2024-03-10 DIAGNOSIS — Z1283 Encounter for screening for malignant neoplasm of skin: Secondary | ICD-10-CM

## 2024-03-10 DIAGNOSIS — Z8589 Personal history of malignant neoplasm of other organs and systems: Secondary | ICD-10-CM

## 2024-03-10 DIAGNOSIS — D485 Neoplasm of uncertain behavior of skin: Secondary | ICD-10-CM

## 2024-03-10 DIAGNOSIS — L570 Actinic keratosis: Secondary | ICD-10-CM

## 2024-03-10 DIAGNOSIS — D229 Melanocytic nevi, unspecified: Secondary | ICD-10-CM

## 2024-03-10 DIAGNOSIS — L578 Other skin changes due to chronic exposure to nonionizing radiation: Secondary | ICD-10-CM

## 2024-03-10 DIAGNOSIS — S40862A Insect bite (nonvenomous) of left upper arm, initial encounter: Secondary | ICD-10-CM

## 2024-03-10 DIAGNOSIS — L821 Other seborrheic keratosis: Secondary | ICD-10-CM

## 2024-03-10 DIAGNOSIS — L814 Other melanin hyperpigmentation: Secondary | ICD-10-CM

## 2024-03-10 DIAGNOSIS — W57XXXA Bitten or stung by nonvenomous insect and other nonvenomous arthropods, initial encounter: Secondary | ICD-10-CM

## 2024-03-10 DIAGNOSIS — Z85828 Personal history of other malignant neoplasm of skin: Secondary | ICD-10-CM

## 2024-03-10 HISTORY — DX: Basal cell carcinoma of skin, unspecified: C44.91

## 2024-03-10 MED ORDER — TRIAMCINOLONE ACETONIDE 0.1 % EX CREA: 1.0000 | TOPICAL_CREAM | Freq: Two times a day (BID) | CUTANEOUS | 1 refills | Status: DC

## 2024-03-10 NOTE — Progress Notes (Signed)
 New Patient Visit   Subjective  Sydney Shaw is a 73 y.o. female who presents for the following: Skin Cancer Screening and Upper Body Skin Exam - She has a history of SCC and BCC (Dr Livingston). Has not been seen by dermatology in ~ 2 years. She has a rash of her chest and abdomen that comes and goes. It is not itchy.  The patient presents for Upper Body Skin Exam (UBSE) for skin cancer screening and mole check. The patient has spots, moles and lesions to be evaluated, some may be new or changing and the patient may have concern these could be cancer.    The following portions of the chart were reviewed this encounter and updated as appropriate: medications, allergies, medical history  Review of Systems:  No other skin or systemic complaints except as noted in HPI or Assessment and Plan.  Objective  Well appearing patient in no apparent distress; mood and affect are within normal limits.  All skin waist up examined. Relevant physical exam findings are noted in the Assessment and Plan.  Left cheek x 1, chest x 12 (13) Erythematous thin papules/macules with gritty scale.        Left midline upper back 1.4 cm pink scaly telangiectatic plaque with central nodule  Assessment & Plan   AK (ACTINIC KERATOSIS) (13) Left cheek x 1, chest x 12 (13) Destruction of lesion - Left cheek x 1, chest x 12 (13) Complexity: simple   Destruction method: cryotherapy   Informed consent: discussed and consent obtained   Timeout:  patient name, date of birth, surgical site, and procedure verified Lesion destroyed using liquid nitrogen: Yes   Region frozen until ice ball extended beyond lesion: Yes   Outcome: patient tolerated procedure well with no complications   Post-procedure details: wound care instructions given   NEOPLASM OF UNCERTAIN BEHAVIOR OF SKIN Left midline upper back Skin / nail biopsy Type of biopsy: tangential   Informed consent: discussed and consent obtained   Timeout:  patient name, date of birth, surgical site, and procedure verified   Procedure prep:  Patient was prepped and draped in usual sterile fashion Prep type:  Isopropyl alcohol Anesthesia: the lesion was anesthetized in a standard fashion   Anesthetic:  1% lidocaine w/ epinephrine 1-100,000 buffered w/ 8.4% NaHCO3 Instrument used: flexible razor blade   Hemostasis achieved with: pressure, aluminum chloride and electrodesiccation   Outcome: patient tolerated procedure well   Post-procedure details: sterile dressing applied and wound care instructions given   Dressing type: bandage and petrolatum   Specimen 1 - Surgical pathology Differential Diagnosis: BCC vs other   Check Margins: No SCREENING EXAM FOR SKIN CANCER   ACTINIC SKIN DAMAGE   LENTIGINES   SEBORRHEIC KERATOSIS   CHERRY ANGIOMA   MULTIPLE BENIGN NEVI   GROVER'S DISEASE   INSECT BITE OF RIGHT UPPER ARM, INITIAL ENCOUNTER   HISTORY OF BASAL CELL CANCER   HISTORY OF SQUAMOUS CELL CARCINOMA   Skin cancer screening performed today.  Actinic Damage - Chronic condition, secondary to cumulative UV/sun exposure - diffuse scaly erythematous macules with underlying dyspigmentation - Recommend daily broad spectrum sunscreen SPF 30+ to sun-exposed areas, reapply every 2 hours as needed.  - Staying in the shade or wearing long sleeves, sun glasses (UVA+UVB protection) and wide brim hats (4-inch brim around the entire circumference of the hat) are also recommended for sun protection.  - Call for new or changing lesions.  Lentigines, Seborrheic Keratoses, Hemangiomas - Benign  normal skin lesions - Benign-appearing - Call for any changes  Melanocytic Nevi - Tan-brown and/or pink-flesh-colored symmetric macules and papules - Benign appearing on exam today - Observation - Call clinic for new or changing moles - Recommend daily use of broad spectrum spf 30+ sunscreen to sun-exposed areas.   GROVER'S DISEASE Exam:  scattered red papules on trunk   Treatment Plan: TMC 0.1% cream Apply to affected areas of rash twice daily until clear     INSECT BITE REACTIONS Exam: Excoriations of arms.  Treatment Plan: TMC 0.1% cream Apply to affected areas of arms twice daily until clear.  HISTORY OF BASAL CELL CARCINOMA OF THE SKIN - No evidence of recurrence today - Recommend regular full body skin exams - Recommend daily broad spectrum sunscreen SPF 30+ to sun-exposed areas, reapply every 2 hours as needed.  - Call if any new or changing lesions are noted between office visits  HISTORY OF SQUAMOUS CELL CARCINOMA OF THE SKIN - No evidence of recurrence today - No lymphadenopathy - Recommend regular full body skin exams - Recommend daily broad spectrum sunscreen SPF 30+ to sun-exposed areas, reapply every 2 hours as needed.  - Call if any new or changing lesions are noted between office visits        Return in about 6 months (around 09/09/2024) for AK follow up.  I, Roseline Hutchinson, CMA, am acting as scribe for Rebekah Zackery K, PA-C .   Documentation: I have reviewed the above documentation for accuracy and completeness, and I agree with the above.  Melondy Blanchard K, PA-C

## 2024-03-10 NOTE — Patient Instructions (Addendum)

## 2024-03-11 LAB — SURGICAL PATHOLOGY

## 2024-03-12 ENCOUNTER — Ambulatory Visit: Payer: Self-pay | Admitting: Physician Assistant

## 2024-03-19 ENCOUNTER — Encounter: Payer: Self-pay | Admitting: Physician Assistant

## 2024-03-19 NOTE — Telephone Encounter (Signed)
 Advised patient of results and sent message to schedulers to schedule Mohs with Dr Paci/hd

## 2024-03-19 NOTE — Telephone Encounter (Signed)
-----   Message from [facility] K sent at 03/12/2024  8:44 AM EDT ----- Mohs - AUC 7 - as it looks to be recurrent from prior treatment.  She has a 6 month f/u scheduled.   ----- Message ----- From: Interface, Lab In [facility] Sent: 03/11/2024   6:13 PM EDT To: Erminio MARLA Like, PA-C

## 2024-04-03 ENCOUNTER — Ambulatory Visit (HOSPITAL_COMMUNITY): Payer: Self-pay | Admitting: Anesthesiology

## 2024-04-03 ENCOUNTER — Ambulatory Visit (HOSPITAL_COMMUNITY)
Admission: RE | Admit: 2024-04-03 | Discharge: 2024-04-03 | Disposition: A | Discharge status: Home / Self Care | Attending: Internal Medicine | Admitting: Internal Medicine

## 2024-04-03 ENCOUNTER — Encounter (HOSPITAL_COMMUNITY): Payer: Self-pay | Admitting: Internal Medicine

## 2024-04-03 ENCOUNTER — Encounter (HOSPITAL_COMMUNITY)
Admission: RE | Disposition: A | Discharge status: Home / Self Care | Payer: Self-pay | Source: Home / Self Care | Attending: Internal Medicine

## 2024-04-03 ENCOUNTER — Other Ambulatory Visit: Payer: Self-pay

## 2024-04-03 ENCOUNTER — Encounter (HOSPITAL_COMMUNITY): Payer: Self-pay | Admitting: Anesthesiology

## 2024-04-03 DIAGNOSIS — I10 Essential (primary) hypertension: Secondary | ICD-10-CM | POA: Insufficient documentation

## 2024-04-03 DIAGNOSIS — D123 Benign neoplasm of transverse colon: Secondary | ICD-10-CM

## 2024-04-03 DIAGNOSIS — K649 Unspecified hemorrhoids: Secondary | ICD-10-CM | POA: Diagnosis not present

## 2024-04-03 DIAGNOSIS — K648 Other hemorrhoids: Secondary | ICD-10-CM

## 2024-04-03 DIAGNOSIS — K5730 Diverticulosis of large intestine without perforation or abscess without bleeding: Secondary | ICD-10-CM

## 2024-04-03 DIAGNOSIS — R1013 Epigastric pain: Secondary | ICD-10-CM

## 2024-04-03 DIAGNOSIS — K635 Polyp of colon: Secondary | ICD-10-CM

## 2024-04-03 DIAGNOSIS — Z1211 Encounter for screening for malignant neoplasm of colon: Secondary | ICD-10-CM

## 2024-04-03 DIAGNOSIS — K641 Second degree hemorrhoids: Secondary | ICD-10-CM | POA: Insufficient documentation

## 2024-04-03 DIAGNOSIS — Z860101 Personal history of adenomatous and serrated colon polyps: Secondary | ICD-10-CM | POA: Diagnosis present

## 2024-04-03 DIAGNOSIS — Z860100 Personal history of colon polyps, unspecified: Secondary | ICD-10-CM

## 2024-04-03 DIAGNOSIS — K644 Residual hemorrhoidal skin tags: Secondary | ICD-10-CM | POA: Diagnosis not present

## 2024-04-03 DIAGNOSIS — R1084 Generalized abdominal pain: Secondary | ICD-10-CM

## 2024-04-03 HISTORY — PX: COLONOSCOPY: SHX5424

## 2024-04-03 HISTORY — PX: ESOPHAGOGASTRODUODENOSCOPY: SHX5428

## 2024-04-03 HISTORY — DX: Nausea with vomiting, unspecified: Z98.890

## 2024-04-03 SURGERY — COLONOSCOPY
Anesthesia: General

## 2024-04-03 MED ORDER — LIDOCAINE 2% (20 MG/ML) 5 ML SYRINGE
INTRAMUSCULAR | Status: DC | PRN
  Administered 2024-04-03: 100 mg via INTRAVENOUS

## 2024-04-03 MED ORDER — PROPOFOL 500 MG/50ML IV EMUL
INTRAVENOUS | Status: DC | PRN
Start: 2024-04-03 — End: 2024-04-03
  Administered 2024-04-03: 50 mg via INTRAVENOUS
  Administered 2024-04-03: 150 ug/kg/min via INTRAVENOUS
  Administered 2024-04-03: 20 mg via INTRAVENOUS

## 2024-04-03 MED ORDER — LACTATED RINGERS IV SOLN
INTRAVENOUS | Status: DC | PRN
Start: 2024-04-03 — End: 2024-04-03

## 2024-04-03 MED ORDER — LACTATED RINGERS IV SOLN: INTRAVENOUS | Status: DC

## 2024-04-03 MED ORDER — PHENYLEPHRINE 80 MCG/ML (10ML) SYRINGE FOR IV PUSH (FOR BLOOD PRESSURE SUPPORT)
PREFILLED_SYRINGE | INTRAVENOUS | Status: DC | PRN
  Administered 2024-04-03: 100 ug via INTRAVENOUS

## 2024-04-03 NOTE — Anesthesia Preprocedure Evaluation (Signed)
 Anesthesia Evaluation  Patient identified by MRN, date of birth, ID band Patient awake    Reviewed: Allergy & Precautions, H&P , NPO status , Patient's Chart, lab work & pertinent test results, reviewed documented beta blocker date and time   History of Anesthesia Complications (+) PONV and history of anesthetic complications  Airway Mallampati: II  TM Distance: >3 FB Neck ROM: full    Dental no notable dental hx.    Pulmonary neg pulmonary ROS   Pulmonary exam normal breath sounds clear to auscultation       Cardiovascular Exercise Tolerance: Good hypertension, negative cardio ROS  Rhythm:regular Rate:Normal     Neuro/Psych negative neurological ROS  negative psych ROS   GI/Hepatic negative GI ROS, Neg liver ROS,,,  Endo/Other  negative endocrine ROS    Renal/GU negative Renal ROS  negative genitourinary   Musculoskeletal   Abdominal   Peds  Hematology negative hematology ROS (+)   Anesthesia Other Findings   Reproductive/Obstetrics negative OB ROS                             Anesthesia Physical Anesthesia Plan  ASA: 2  Anesthesia Plan: General   Post-op Pain Management:    Induction:   PONV Risk Score and Plan: Propofol infusion  Airway Management Planned:   Additional Equipment:   Intra-op Plan:   Post-operative Plan:   Informed Consent: I have reviewed the patients History and Physical, chart, labs and discussed the procedure including the risks, benefits and alternatives for the proposed anesthesia with the patient or authorized representative who has indicated his/her understanding and acceptance.     Dental Advisory Given  Plan Discussed with: CRNA  Anesthesia Plan Comments:        Anesthesia Quick Evaluation

## 2024-04-03 NOTE — Discharge Instructions (Addendum)
 EGD Discharge instructions Please read the instructions outlined below and refer to this sheet in the next few weeks. These discharge instructions provide you with general information on caring for yourself after you leave the hospital. Your doctor may also give you specific instructions. While your treatment has been planned according to the most current medical practices available, unavoidable complications occasionally occur. If you have any problems or questions after discharge, please call your doctor. ACTIVITY You may resume your regular activity but move at a slower pace for the next 24 hours.  Take frequent rest periods for the next 24 hours.  Walking will help expel (get rid of) the air and reduce the bloated feeling in your abdomen.  No driving for 24 hours (because of the anesthesia (medicine) used during the test).  You may shower.  Do not sign any important legal documents or operate any machinery for 24 hours (because of the anesthesia used during the test).  NUTRITION Drink plenty of fluids.  You may resume your normal diet.  Begin with a light meal and progress to your normal diet.  Avoid alcoholic beverages for 24 hours or as instructed by your caregiver.  MEDICATIONS You may resume your normal medications unless your caregiver tells you otherwise.  WHAT YOU CAN EXPECT TODAY You may experience abdominal discomfort such as a feeling of fullness or "gas" pains.  FOLLOW-UP Your doctor will discuss the results of your test with you.  SEEK IMMEDIATE MEDICAL ATTENTION IF ANY OF THE FOLLOWING OCCUR: Excessive nausea (feeling sick to your stomach) and/or vomiting.  Severe abdominal pain and distention (swelling).  Trouble swallowing.  Temperature over 101 F (37.8 C).  Rectal bleeding or vomiting of blood.    Colonoscopy Discharge Instructions  Read the instructions outlined below and refer to this sheet in the next few weeks. These discharge instructions provide you with  general information on caring for yourself after you leave the hospital. Your doctor may also give you specific instructions. While your treatment has been planned according to the most current medical practices available, unavoidable complications occasionally occur. If you have any problems or questions after discharge, call Dr. Shaaron at [phone]. ACTIVITY You may resume your regular activity, but move at a slower pace for the next 24 hours.  Take frequent rest periods for the next 24 hours.  Walking will help get rid of the air and reduce the bloated feeling in your belly (abdomen).  No driving for 24 hours (because of the medicine (anesthesia) used during the test).   Do not sign any important legal documents or operate any machinery for 24 hours (because of the anesthesia used during the test).  NUTRITION Drink plenty of fluids.  You may resume your normal diet as instructed by your doctor.  Begin with a light meal and progress to your normal diet. Heavy or fried foods are harder to digest and may make you feel sick to your stomach (nauseated).  Avoid alcoholic beverages for 24 hours or as instructed.  MEDICATIONS You may resume your normal medications unless your doctor tells you otherwise.  WHAT YOU CAN EXPECT TODAY Some feelings of bloating in the abdomen.  Passage of more gas than usual.  Spotting of blood in your stool or on the toilet paper.  IF YOU HAD POLYPS REMOVED DURING THE COLONOSCOPY: No aspirin products for 7 days or as instructed.  No alcohol for 7 days or as instructed.  Eat a soft diet for the next 24 hours.  FINDING  OUT THE RESULTS OF YOUR TEST Not all test results are available during your visit. If your test results are not back during the visit, make an appointment with your caregiver to find out the results. Do not assume everything is normal if you have not heard from your caregiver or the medical facility. It is important for you to follow up on all of your test  results.  SEEK IMMEDIATE MEDICAL ATTENTION IF: You have more than a spotting of blood in your stool.  Your belly is swollen (abdominal distention).  You are nauseated or vomiting.  You have a temperature over 101.  You have abdominal pain or discomfort that is severe or gets worse throughout the day.     your upper GI tract appeared normal   1 polyp removed from your colon.  You do have diverticulosis  Further recommendations to follow pending review of pathology report   office visit with  Sonny Kerns in 6 weeks Message sent to office

## 2024-04-03 NOTE — Op Note (Signed)
 [facility] Patient Name: Sydney Shaw Procedure Date: 04/03/2024 9:42 AM MRN: [id number] Date of Birth: 27-Dec-1950 Attending MD: Lamar Ozell Hollingshead , MD, [id number] CSN: [id number] Age: 73 Admit Type: Outpatient Procedure:                Upper GI endoscopy Indications:              Epigastric abdominal pain Providers:                Lamar Ozell Hollingshead, MD, Leandrew Edelman RN, RN,                            Daphne Mulch Technician, Technician Referring MD:              Medicines:                Propofol  per Anesthesia Complications:            No immediate complications. Estimated Blood Loss:     Estimated blood loss: none. Procedure:                Pre-Anesthesia Assessment:                           - Prior to the procedure, a History and Physical                            was performed, and patient medications and                            allergies were reviewed. The patient's tolerance of                            previous anesthesia was also reviewed. The risks                            and benefits of the procedure and the sedation                            options and risks were discussed with the patient.                            All questions were answered, and informed consent                            was obtained. Prior Anticoagulants: The patient has                            taken no anticoagulant or antiplatelet agents. ASA                            Grade Assessment: III - A patient with severe                            systemic disease. After reviewing the risks and  benefits, the patient was deemed in satisfactory                            condition to undergo the procedure.                           After obtaining informed consent, the endoscope was                            passed under direct vision. Throughout the                            procedure, the patient's blood pressure, pulse, and                             oxygen saturations were monitored continuously. The                            [id number]-[id number] ([id number]) scope was introduced through the                            mouth, and advanced to the second part of duodenum.                            The upper GI endoscopy was accomplished without                            difficulty. The patient tolerated the procedure                            well. Scope In: 10:09:04 AM Scope Out: 10:11:49 AM Total Procedure Duration: 0 hours 2 minutes 45 seconds  Findings:      The examined esophagus was normal.      The entire examined stomach was normal.      The duodenal bulb and second portion of the duodenum were normal. Impression:               - Normal esophagus.                           - Normal stomach.                           - Normal duodenal bulb and second portion of the                            duodenum.                           - No specimens collected. Moderate Sedation:      Moderate (conscious) sedation was personally administered by an       anesthesia professional. The following parameters were monitored: oxygen       saturation, heart rate, blood pressure, respiratory rate, EKG, adequacy       of pulmonary ventilation, and response to care. Recommendation:           -  Patient has a contact number available for                            emergencies. The signs and symptoms of potential                            delayed complications were discussed with the                            patient. Return to normal activities tomorrow.                            Written discharge instructions were provided to the                            patient.                           - Advance diet as tolerated.                           - Continue present medications. See colonoscopy                            report.                           - Return to my office in 6 weeks. Procedure Code(s):        --- Professional ---                            [phone], Esophagogastroduodenoscopy, flexible,                            transoral; diagnostic, including collection of                            specimen(s) by brushing or washing, when performed                            (separate procedure) Diagnosis Code(s):        --- Professional ---                           R10.13, Epigastric pain CPT copyright 2022 American Medical Association. All rights reserved. The codes documented in this report are preliminary and upon coder review may  be revised to meet current compliance requirements. Lamar HERO. Dondre Catalfamo, MD Lamar Ozell Hollingshead, MD 04/03/2024 10:35:13 AM This report has been signed electronically. Number of Addenda: 0

## 2024-04-03 NOTE — H&P (Signed)
 @LOGO @   Primary Care Physician:  Toribio Jerel MATSU, MD Primary Gastroenterologist:  Dr. Shaaron  Pre-Procedure History & Physical: HPI:  Sydney Shaw is a 73 y.o. female here for  further evaluation of epigastric pain negative CT.  Overdue for surveillance colonoscopy multiple colonic polyps removed previously.  Past Medical History:  Diagnosis Date   Basal cell carcinoma 03/10/2024   Left midline upper back - Needs Mohs   Medical history non-contributory    PONV (postoperative nausea and vomiting)    Squamous cell carcinoma of skin 07/30/2018   V of neck - CX3 + excision   Squamous cell carcinoma of skin 07/30/2018   right shin - CX3 + 5FU   Superficial basal cell carcinoma 07/30/2018   left breast - CX3 + 5FU   Superficial basal cell carcinoma 06/15/2020   nod-chest-medial(center) (CX35FU)   Superficial basal cell carcinoma (BCC) 05/02/2016   left upperback - CX3 + 5FU    Past Surgical History:  Procedure Laterality Date   AUGMENTATION MAMMAPLASTY Left 1992   left implant rupture. Only left implant replaced.   BREAST ENHANCEMENT SURGERY Bilateral 1981   COLONOSCOPY     COLONOSCOPY N/A 07/16/2015   Procedure: COLONOSCOPY;  Surgeon: Lamar CHRISTELLA Shaaron, MD;  Location: [facility];  Service: Endoscopy;  Laterality: N/A;  10:45 Am    Prior to Admission medications   Medication Sig Start Date End Date Taking? Authorizing Provider  Multiple Vitamin (MULTIVITAMIN) capsule Take 1 capsule by mouth daily.   Yes [provider]  Na Sulfate-K Sulfate-Mg Sulfate concentrate (SUPREP) 17.5-3.13-1.6 GM/177ML SOLN As directed 03/03/24  Yes Shaeley Segall, Lamar CHRISTELLA, MD  NON FORMULARY Take 1 capsule by mouth daily. Probiotic   daily   Yes [provider]  Omega-3 Fatty Acids (FISH OIL BURP-LESS PO) Take by mouth.   Yes [provider]  triamcinolone  cream (KENALOG ) 0.1 % Apply 1 Application topically 2 (two) times daily. To affected areas of chest and arms until clear.  Avoid face, groin, underarms. 03/10/24  Yes Orman Erminio POUR, PA-C    Allergies as of 03/03/2024   (No Known Allergies)    Family History  Problem Relation Age of Onset   Lung cancer Mother    Lung cancer Sister    Lung cancer Sister    Colon cancer Neg Hx    Inflammatory bowel disease Neg Hx    Celiac disease Neg Hx     Social History   Socioeconomic History   Marital status: Married    Spouse name: Not on file   Number of children: Not on file   Years of education: Not on file   Highest education level: Not on file  Occupational History   Not on file  Tobacco Use   Smoking status: Never   Smokeless tobacco: Never  Vaping Use   Vaping status: Never Used  Substance and Sexual Activity   Alcohol use: Never   Drug use: Never   Sexual activity: Not Currently  Other Topics Concern   Not on file  Social History Narrative   Not on file   Social Drivers of Health   Financial Resource Strain: Not on file  Food Insecurity: Not on file  Transportation Needs: Not on file  Physical Activity: Not on file  Stress: Not on file  Social Connections: Not on file  Intimate Partner Violence: Not on file    Review of Systems: See HPI, otherwise negative ROS  Physical Exam: BP (!) 143/75  Pulse 94   Temp 97.8 F (36.6 C) (Oral)   Resp 15   Ht 5' 4 (1.626 m)   Wt 45.4 kg   SpO2 98%   BMI 17.16 kg/m  General:   Alert,  Well-developed, well-nourished, pleasant and cooperative in NAD Mouth:  No deformity or lesions. Neck:  Supple; no masses or thyromegaly. No significant cervical adenopathy. Lungs:  Clear throughout to auscultation.   No wheezes, crackles, or rhonchi. No acute distress. Heart:  Regular rate and rhythm; no murmurs, clicks, rubs,  or gallops. Abdomen: Non-distended, normal bowel sounds.  Soft and nontender without appreciable mass or hepatosplenomegaly.   Impression/Plan:    73 year old lady with epigastric pain and history of colonic adenomas.   Patient is undergoing EGD and colonoscopy today per plan. The risks, benefits, limitations, imponderables and alternatives regarding both EGD and colonoscopy have been reviewed with the patient. Questions have been answered. All parties agreeable.       Notice: This dictation was prepared with Dragon dictation along with smaller phrase technology. Any transcriptional errors that result from this process are unintentional and may not be corrected upon review.

## 2024-04-03 NOTE — Op Note (Signed)
 [facility] Patient Name: Sydney Shaw Procedure Date: 04/03/2024 9:36 AM MRN: [id number] Date of Birth: Jun 03, 1951 Attending MD: Lamar Ozell Hollingshead , MD, [id number] CSN: [id number] Age: 73 Admit Type: Outpatient Procedure:                Colonoscopy Indications:              High risk colon cancer surveillance: Personal                            history of colonic polyps Providers:                Lamar Ozell Hollingshead, MD, Leandrew Edelman RN, RN,                            Daphne Mulch Technician, Technician Referring MD:              Medicines:                Propofol  per Anesthesia Complications:            No immediate complications. Estimated Blood Loss:     Estimated blood loss was minimal. Procedure:                Pre-Anesthesia Assessment:                           - Prior to the procedure, a History and Physical                            was performed, and patient medications and                            allergies were reviewed. The patient's tolerance of                            previous anesthesia was also reviewed. The risks                            and benefits of the procedure and the sedation                            options and risks were discussed with the patient.                            All questions were answered, and informed consent                            was obtained. Prior Anticoagulants: The patient has                            taken no anticoagulant or antiplatelet agents. ASA                            Grade Assessment: III - A patient with severe  systemic disease. After reviewing the risks and                            benefits, the patient was deemed in satisfactory                            condition to undergo the procedure.                           After obtaining informed consent, the colonoscope                            was passed under direct vision. Throughout the                             procedure, the patient's blood pressure, pulse, and                            oxygen saturations were monitored continuously. The                            [id number]-HQ190L ([id number]) scope was introduced through                            the anus and advanced to the the cecum, identified                            by appendiceal orifice and ileocecal valve. The                            colonoscopy was performed without difficulty. The                            patient tolerated the procedure well. The quality                            of the bowel preparation was adequate. The                            colonoscopy was performed without difficulty. The                            patient tolerated the procedure well. The quality                            of the bowel preparation was adequate. The                            ileocecal valve, appendiceal orifice, and rectum                            were photographed. Scope In: 10:16:41 AM Scope Out: 10:32:45 AM Scope Withdrawal Time: 0 hours 6 minutes 55 seconds  Total Procedure Duration: 0 hours 16 minutes 4  seconds  Findings:      Scattered medium-mouthed diverticula were found in the sigmoid colon and       descending colon.      A 5 mm polyp was found in the hepatic flexure. The polyp was       carpet-like. The polyp was removed with a cold snare. Resection and       retrieval were complete. Estimated blood loss was minimal. The       polypectomy site was rather large for the size of the polyp. There is no       bleeding. It was superficial. It was painted with pure a stat      Non-bleeding external and internal hemorrhoids were found during       retroflexion. The hemorrhoids were mild, small and Grade II (internal       hemorrhoids that prolapse but reduce spontaneously). Impression:               - Diverticulosis in the sigmoid colon and in the                            descending colon.                           - One 5 mm  polyp at the hepatic flexure, removed                            with a cold snare. Resected and retrieved.                           - Non-bleeding external and internal hemorrhoids. Moderate Sedation:      Moderate (conscious) sedation was personally administered by an       anesthesia professional. The following parameters were monitored: oxygen       saturation, heart rate, blood pressure, respiratory rate, EKG, adequacy       of pulmonary ventilation, and response to care. Recommendation:           - Patient has a contact number available for                            emergencies. The signs and symptoms of potential                            delayed complications were discussed with the                            patient. Return to normal activities tomorrow.                            Written discharge instructions were provided to the                            patient.                           - Advance diet as tolerated.                           -  Continue present medications.                           - Repeat colonoscopy date to be determined after                            pending pathology results are reviewed for                            surveillance.                           - Return to GI office in 6 months. Procedure Code(s):        --- Professional ---                           [phone], Colonoscopy, flexible; with removal of                            tumor(s), polyp(s), or other lesion(s) by snare                            technique Diagnosis Code(s):        --- Professional ---                           Z86.010, Personal history of colonic polyps                           K64.1, Second degree hemorrhoids                           D12.3, Benign neoplasm of transverse colon (hepatic                            flexure or splenic flexure)                           K57.30, Diverticulosis of large intestine without                            perforation or abscess  without bleeding CPT copyright 2022 [employer]. All rights reserved. The codes documented in this report are preliminary and upon coder review may  be revised to meet current compliance requirements. Lamar HERO. Mistee Soliman, MD Lamar Ozell Hollingshead, MD 04/03/2024 10:40:43 AM This report has been signed electronically. Number of Addenda: 0

## 2024-04-03 NOTE — Transfer of Care (Addendum)
 Immediate Anesthesia Transfer of Care Note  Patient: Sydney Shaw  Procedure(s) Performed: COLONOSCOPY EGD (ESOPHAGOGASTRODUODENOSCOPY)  Patient Location: Endoscopy Unit  Anesthesia Type:General  Level of Consciousness: drowsy and patient cooperative  Airway & Oxygen Therapy: Patient Spontanous Breathing  Post-op Assessment: Report given to RN and Post -op Vital signs reviewed and stable  Post vital signs: Reviewed and stable  Last Vitals:  Vitals Value Taken Time  BP 95/49 04/03/24   10:39  Temp 36.2 0724/25    10:39  Pulse 70 04/03/24   10:39  Resp 12 04/03/24   10:39  SpO2 100% 04/03/24   10:39    Last Pain:  Vitals:   04/03/24 1001  TempSrc:   PainSc: 0-No pain      Patients Stated Pain Goal: 6 (04/03/24 0801)  Complications: No notable events documented.

## 2024-04-04 ENCOUNTER — Encounter (HOSPITAL_COMMUNITY): Payer: Self-pay | Admitting: Internal Medicine

## 2024-04-04 LAB — SURGICAL PATHOLOGY

## 2024-04-05 NOTE — Anesthesia Postprocedure Evaluation (Signed)
 Anesthesia Post Note  Patient: Sydney Shaw  Procedure(s) Performed: COLONOSCOPY EGD (ESOPHAGOGASTRODUODENOSCOPY)  Patient location during evaluation: Phase II Anesthesia Type: General Level of consciousness: awake Pain management: pain level controlled Vital Signs Assessment: post-procedure vital signs reviewed and stable Respiratory status: spontaneous breathing and respiratory function stable Cardiovascular status: blood pressure returned to baseline and stable Postop Assessment: no headache and no apparent nausea or vomiting Anesthetic complications: no Comments: Late entry   No notable events documented.   Last Vitals:  Vitals:   04/03/24 0801 04/03/24 1039  BP: (!) 143/75 (!) 95/49  Pulse: 94 69  Resp: 15 12  Temp: 36.6 C (!) 36.2 C  SpO2: 98% 100%    Last Pain:  Vitals:   04/03/24 1039  TempSrc: Axillary  PainSc: 0-No pain                 Yvonna JINNY Bosworth

## 2024-04-07 ENCOUNTER — Ambulatory Visit: Payer: Self-pay | Admitting: Internal Medicine

## 2024-04-22 ENCOUNTER — Encounter: Payer: Self-pay | Admitting: Dermatology

## 2024-04-23 ENCOUNTER — Encounter: Payer: Self-pay | Admitting: Dermatology

## 2024-04-23 ENCOUNTER — Ambulatory Visit: Admitting: Dermatology

## 2024-04-23 VITALS — BP 135/78 | HR 86 | Temp 98.0°F

## 2024-04-23 DIAGNOSIS — L814 Other melanin hyperpigmentation: Secondary | ICD-10-CM | POA: Diagnosis not present

## 2024-04-23 DIAGNOSIS — L579 Skin changes due to chronic exposure to nonionizing radiation, unspecified: Secondary | ICD-10-CM | POA: Diagnosis not present

## 2024-04-23 DIAGNOSIS — C4491 Basal cell carcinoma of skin, unspecified: Secondary | ICD-10-CM

## 2024-04-23 DIAGNOSIS — C44519 Basal cell carcinoma of skin of other part of trunk: Secondary | ICD-10-CM | POA: Diagnosis not present

## 2024-04-23 NOTE — Progress Notes (Signed)
 Follow-Up Visit   Subjective  Sydney Shaw is a 73 y.o. female who presents for the following: Mohs of a Superficial and Nodular Basal Cell Carcinoma of the left midline upper back, referred by Erminio Like, PA-C.  The following portions of the chart were reviewed this encounter and updated as appropriate: medications, allergies, medical history  Review of Systems:  No other skin or systemic complaints except as noted in HPI or Assessment and Plan.  Objective  Well appearing patient in no apparent distress; mood and affect are within normal limits.  A focused examination was performed of the following areas: Left midline upper back Relevant physical exam findings are noted in the Assessment and Plan.   Left midline Upper Back Healing biopsy site   Assessment & Plan   BASAL CELL CARCINOMA (BCC), UNSPECIFIED SITE Left midline Upper Back Mohs surgery  Consent obtained: written  Anticoagulation: Was the anticoagulation regimen changed prior to Mohs? No    Anesthesia: Anesthesia method: local infiltration Local anesthetic: lidocaine  1% WITH epi  Procedure Details: Timeout: pre-procedure verification complete Procedure Prep: patient was prepped and draped in usual sterile fashion Biopsy accession number: [id number] Pre-Op diagnosis: basal cell carcinoma BCC subtype: nodular and superficial Surgical site (from skin exam): Left midline Upper Back Pre-operative length (cm): 1.3 Pre-operative width (cm): 1.2  Micrographic Surgery Details: Post-operative length (cm): 2 Post-operative width (cm): 2 Number of Mohs stages: 1 Cumulative additional sections past 5 per stage: 0 Post surgery depth of defect: subcutaneous fat  Stage 1    Tumor features identified on Mohs section: no tumor identified  Reconstruction: Was the defect reconstructed? Yes   Was reconstruction performed by the same Mohs surgeon? Yes   Setting of reconstruction: outpatient office When  was reconstruction performed? same day Type of reconstruction: linear  Skin repair Complexity:  Complex Final length (cm):  5.8 Informed consent: discussed and consent obtained   Timeout: patient name, date of birth, surgical site, and procedure verified   Procedure prep:  Patient was prepped and draped in usual sterile fashion Prep type:  Chlorhexidine Anesthesia: the lesion was anesthetized in a standard fashion   Anesthetic:  1% lidocaine  w/ epinephrine 1-100,000 buffered w/ 8.4% NaHCO3 Reason for type of repair: reduce tension to allow closure, preserve normal anatomy, preserve normal anatomical and functional relationships, avoid adjacent structures and allow side-to-side closure without requiring a flap or graft   Undermining: area extensively undermined   Subcutaneous layers (deep stitches):  Suture size:  3-0 Suture type: PDS (polydioxanone)   Suture type comment:  With dermabond and steri strips Stitches:  Buried vertical mattress Fine/surface layer approximation (top stitches):  Suture type: cyanoacrylate tissue glue   Hemostasis achieved with: suture, pressure and electrodesiccation Outcome: patient tolerated procedure well with no complications   Post-procedure details: sterile dressing applied and wound care instructions given   Dressing type: bandage and pressure dressing      Return in about 4 weeks (around 05/21/2024) for wound check.  LILLETTE Darice Smock, CMA, am acting as scribe for RUFUS CHRISTELLA HOLY, MD.    04/23/2024  HISTORY OF PRESENT ILLNESS  Sydney Shaw is seen in consultation at the request of Erminio Like, PA-C for biopsy-proven Superficial and Nodular Basal Cell Carcinoma of the left upper back. They note that the area has been present for about 6 months increasing in size with time.  There is no history of previous treatment.  Reports no other new or changing lesions and has no other  complaints today.  Medications and allergies: see patient  chart.  Review of systems: Reviewed 8 systems and notable for the above skin cancer.  All other systems reviewed are unremarkable/negative, unless noted in the HPI. Past medical history, surgical history, family history, social history were also reviewed and are noted in the chart/questionnaire.    PHYSICAL EXAMINATION  General: Well-appearing, in no acute distress, alert and oriented x 4. Vitals reviewed in chart (if available).   Skin: Exam reveals a 1.3 x 1.2 cm erythematous papule and biopsy scar on the left upper back. There are rhytids, telangiectasias, and lentigines, consistent with photodamage.  Biopsy report(s) reviewed, confirming the diagnosis.   ASSESSMENT  1) Superficial and Nodular Basal Cell Carcinoma on the left upper back 2) photodamage 3) solar lentigines   PLAN   1. Due to location, size, histology, or recurrence and the likelihood of subclinical extension as well as the need to conserve normal surrounding tissue, the patient was deemed acceptable for Mohs micrographic surgery (MMS).  The nature and purpose of the procedure, associated benefits and risks including recurrence and scarring, possible complications such as pain, infection, and bleeding, and alternative methods of treatment if appropriate were discussed with the patient during consent. The lesion location was verified by the patient, by reviewing previous notes, pathology reports, and by photographs as well as angulation measurements if available.  Informed consent was reviewed and signed by the patient, and timeout was performed at 9:30 AM. See op note below.  2. For the photodamage and solar lentigines, sun protection discussed/information given on OTC sunscreens, and we recommend continued regular follow-up with primary dermatologist every 6 months or sooner for any growing, bleeding, or changing lesions. 3. Prognosis and future surveillance discussed. 4. Letter with treatment outcome sent to referring  provider. 5. Pain acetaminophen/ibuprofen  MOHS MICROGRAPHIC SURGERY AND RECONSTRUCTION  Initial size:   1.3 x 1.2 cm Surgical defect/wound size: 2.0 x 2.0 cm Anesthesia:    0.33% lidocaine with 1:200,000 epinephrine EBL:    <5 mL Complications:  None Repair type:   Complex SQ suture:   3-0 PDS Cutaneous suture:  Cyanoacrylate and Steristrips Final size of the repair: 5.8 cm  Stages: 1  STAGE I: Anesthesia achieved with 0.5% lidocaine with 1:200,000 epinephrine. ChloraPrep applied. 1 section(s) excised using Mohs technique (this includes total peripheral and deep tissue margin excision and evaluation with frozen sections, excised and interpreted by the same physician). The tumor was first debulked and then excised with an approx. 2mm margin.  Hemostasis was achieved with electrocautery as needed.  The specimen was then oriented, subdivided/relaxed, inked, and processed using Mohs technique.    Frozen section analysis revealed a clear deep and peripheral margin.  Reconstruction  The surgical wound was then cleaned, prepped, and re-anesthetized as above. Wound edges were undermined extensively along at least one entire edge and at a distance equal to or greater than the width of the defect (see wound defect size above) in order to achieve closure and decrease wound tension and anatomic distortion. Redundant tissue repair including standing cone removal was performed. Hemostasis was achieved with electrocautery. Subcutaneous and epidermal tissues were approximated with the above sutures. The surgical site was then lightly scrubbed with sterile, saline-soaked gauze. Steri-strips were applied, and the area was then bandaged using Vaseline ointment, non-adherent gauze, gauze pads, and tape to provide an adequate pressure dressing. The patient tolerated the procedure well, was given detailed written and verbal wound care instructions, and was discharged in good  condition.   The patient will  follow-up: 4 weeks.   Documentation: I have reviewed the above documentation for accuracy and completeness, and I agree with the above.  RUFUS CHRISTELLA HOLY, MD

## 2024-04-23 NOTE — Patient Instructions (Signed)

## 2024-04-29 ENCOUNTER — Encounter: Payer: Self-pay | Admitting: Dermatology

## 2024-05-13 NOTE — Progress Notes (Unsigned)
 GI Office Note    Referring Provider: Toribio Jerel MATSU, MD Primary Care Physician:  Toribio Jerel MATSU, MD  Primary Gastroenterologist:Michael Shaaron, MD   Chief Complaint   No chief complaint on file.   History of Present Illness   Sydney Shaw is a 73 y.o. female presenting today for follow up. Last seen in office in 01/2024. Seen at that time of postprandial epigastric pain radiating to left abdomen, change in bowels.    Prior Data   CT A/P with contrast 02/2024: IMPRESSION: 1. No acute abnormality in the abdomen or pelvis. 2. 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 3. Early filling of dilated bilateral gonadal veins and pelvic collateral vessels, which can be seen in the setting of pelvic congestion syndrome. 4.  Aortic Atherosclerosis (ICD10-I70.0).    EGD 03/2024: -normal esophagus -normal stomach -normal duodenal bulb and second portion of duodenum  Colonoscopy 03/2024: -diverticulosis  -one 5mm polyp hepatic flexure, tubular adenoma -nonbleeding external and internal hemorrhoids -colonoscopy in 7 years if overall health permites    Medications   Current Outpatient Medications  Medication Sig Dispense Refill   Multiple Vitamin (MULTIVITAMIN) capsule Take 1 capsule by mouth daily.     Na Sulfate-K Sulfate-Mg Sulfate concentrate (SUPREP) 17.5-3.13-1.6 GM/177ML SOLN As directed 354 mL 0   NON FORMULARY Take 1 capsule by mouth daily. Probiotic   daily     Omega-3 Fatty Acids (FISH OIL BURP-LESS PO) Take by mouth.     triamcinolone  cream (KENALOG ) 0.1 % Apply 1 Application topically 2 (two) times daily. To affected areas of chest and arms until clear. Avoid face, groin, underarms. 80 g 1   No current facility-administered medications for this visit.    Allergies   Allergies as of 05/14/2024   (No Known Allergies)     Past Medical History   Past Medical History:  Diagnosis Date   Basal cell carcinoma  03/10/2024   Left midline upper back - Needs Mohs   Medical history non-contributory    PONV (postoperative nausea and vomiting)    Squamous cell carcinoma of skin 07/30/2018   V of neck - CX3 + excision   Squamous cell carcinoma of skin 07/30/2018   right shin - CX3 + 5FU   Superficial basal cell carcinoma 07/30/2018   left breast - CX3 + 5FU   Superficial basal cell carcinoma 06/15/2020   nod-chest-medial(center) (CX35FU)   Superficial basal cell carcinoma (BCC) 05/02/2016   left upperback - CX3 + 5FU    Past Surgical History   Past Surgical History:  Procedure Laterality Date   AUGMENTATION MAMMAPLASTY Left 1992   left implant rupture. Only left implant replaced.   BREAST ENHANCEMENT SURGERY Bilateral 1981   COLONOSCOPY     COLONOSCOPY N/A 07/16/2015   Procedure: COLONOSCOPY;  Surgeon: Lamar CHRISTELLA Shaaron, MD;  Location: [facility];  Service: Endoscopy;  Laterality: N/A;  10:45 Am   COLONOSCOPY N/A 04/03/2024   Procedure: COLONOSCOPY;  Surgeon: Shaaron Lamar CHRISTELLA, MD;  Location: [facility];  Service: Endoscopy;  Laterality: N/A;  9:30 AM, ASA 2   ESOPHAGOGASTRODUODENOSCOPY N/A 04/03/2024   Procedure: EGD (ESOPHAGOGASTRODUODENOSCOPY);  Surgeon: Shaaron Lamar CHRISTELLA, MD;  Location: [facility];  Service: Endoscopy;  Laterality: N/A;    Past Family History   Family History  Problem Relation Age of Onset   Lung cancer Mother    Lung cancer Sister    Lung cancer Sister    Colon  cancer Neg Hx    Inflammatory bowel disease Neg Hx    Celiac disease Neg Hx     Past Social History   Social History   Socioeconomic History   Marital status: Married    Spouse name: Not on file   Number of children: Not on file   Years of education: Not on file   Highest education level: Not on file  Occupational History   Not on file  Tobacco Use   Smoking status: Never   Smokeless tobacco: Never  Vaping Use   Vaping status: Never Used  Substance and Sexual Activity   Alcohol use: Never    Drug use: Never   Sexual activity: Not Currently  Other Topics Concern   Not on file  Social History Narrative   Not on file   Social Drivers of Health   Financial Resource Strain: Not on file  Food Insecurity: Not on file  Transportation Needs: Not on file  Physical Activity: Not on file  Stress: Not on file  Social Connections: Not on file  Intimate Partner Violence: Not on file    Review of Systems   General: Negative for anorexia, weight loss, fever, chills, fatigue, weakness. ENT: Negative for hoarseness, difficulty swallowing , nasal congestion. CV: Negative for chest pain, angina, palpitations, dyspnea on exertion, peripheral edema.  Respiratory: Negative for dyspnea at rest, dyspnea on exertion, cough, sputum, wheezing.  GI: See history of present illness. GU:  Negative for dysuria, hematuria, urinary incontinence, urinary frequency, nocturnal urination.  Endo: Negative for unusual weight change.     Physical Exam   There were no vitals taken for this visit.   General: Well-nourished, well-developed in no acute distress.  Eyes: No icterus. Mouth: Oropharyngeal mucosa moist and pink   Lungs: Clear to auscultation bilaterally.  Heart: Regular rate and rhythm, no murmurs rubs or gallops.  Abdomen: Bowel sounds are normal, nontender, nondistended, no hepatosplenomegaly or masses,  no abdominal bruits or hernia , no rebound or guarding.  Rectal: not performed Extremities: No lower extremity edema. No clubbing or deformities. Neuro: Alert and oriented x 4   Skin: Warm and dry, no jaundice.   Psych: Alert and cooperative, normal mood and affect.  Labs   Lab Results  Component Value Date   NA 137 02/28/2024   CL 101 02/28/2024   K 5.0 02/28/2024   CO2 20 02/28/2024   BUN 11 02/28/2024   CREATININE 0.66 02/28/2024   EGFR 93 02/28/2024   CALCIUM 9.9 02/28/2024   ALBUMIN 3.9 02/12/2024   GLUCOSE 92 02/28/2024   Lab Results  Component Value Date   ALT 13  02/12/2024   AST 21 02/12/2024   ALKPHOS 55 02/12/2024   BILITOT 0.8 02/12/2024   Lab Results  Component Value Date   LIPASE 35 02/12/2024    Imaging Studies   No results found.  Assessment/Plan:           Sonny RAMAN. Ezzard, MHS, PA-C [facility]

## 2024-05-14 ENCOUNTER — Ambulatory Visit: Admitting: Gastroenterology

## 2024-05-14 ENCOUNTER — Encounter: Payer: Self-pay | Admitting: Gastroenterology

## 2024-05-14 VITALS — BP 144/80 | HR 74 | Temp 98.1°F | Ht 64.0 in | Wt 104.2 lb

## 2024-05-14 DIAGNOSIS — R634 Abnormal weight loss: Secondary | ICD-10-CM

## 2024-05-14 DIAGNOSIS — Z860101 Personal history of adenomatous and serrated colon polyps: Secondary | ICD-10-CM | POA: Diagnosis not present

## 2024-05-14 DIAGNOSIS — R198 Other specified symptoms and signs involving the digestive system and abdomen: Secondary | ICD-10-CM

## 2024-05-14 DIAGNOSIS — K5909 Other constipation: Secondary | ICD-10-CM | POA: Diagnosis not present

## 2024-05-14 NOTE — Patient Instructions (Signed)
 Monitor your weight every two weeks. If ongoing weight loss, please let us  or your PCP know.  If needed, you can use a stool softener such as docusate sodium 100mg  at bedtime as needed OR miralax one capful daily as needed to maintain soft stools.   Consider another colonoscopy at age 73 due to having precancerous colon polyp.  Continue to follow with your PCP regarding splenic artery aneurysm.   Office visit as needed.

## 2024-05-21 ENCOUNTER — Ambulatory Visit: Admitting: Dermatology

## 2024-06-09 ENCOUNTER — Ambulatory Visit: Admitting: Dermatology

## 2024-06-09 ENCOUNTER — Encounter: Payer: Self-pay | Admitting: Dermatology

## 2024-06-09 VITALS — BP 131/63 | HR 84

## 2024-06-09 DIAGNOSIS — L905 Scar conditions and fibrosis of skin: Secondary | ICD-10-CM | POA: Diagnosis not present

## 2024-06-09 DIAGNOSIS — Z85828 Personal history of other malignant neoplasm of skin: Secondary | ICD-10-CM

## 2024-06-09 DIAGNOSIS — C4491 Basal cell carcinoma of skin, unspecified: Secondary | ICD-10-CM

## 2024-06-09 NOTE — Progress Notes (Signed)
   Follow Up Visit   Subjective  Sydney Shaw is a 73 y.o. female who presents for the following: follow up from Mohs surgery   The patient presents for follow up from Mohs surgery for a BCC on the left midline back, treated on 04/23/24, repaired with linear closure. The patient has been bandaging the wound as directed. The endorse the following concerns: none  The following portions of the chart were reviewed this encounter and updated as appropriate: medications, allergies, medical history  Review of Systems:  No other skin or systemic complaints except as noted in HPI or Assessment and Plan.  Objective  Well appearing patient in no apparent distress; mood and affect are within normal limits.  A focal examination was performed including scalp, head, face and left midline back. All findings within normal limits unless otherwise noted below.  Healing wound with mild erythema  Relevant physical exam findings are noted in the Assessment and Plan.       Assessment & Plan   Scar s/p Mohs for [facility], treated on 04/23/24, repaired with linear closure - Reassured that wound is healing well - No evidence of infection - No swelling, induration, purulence, dehiscence, or tenderness out of proportion to the clinical exam, see photo above - Discussed that scars take up to 12 months to mature from the date of surgery - Recommend SPF 30+ to scar daily to prevent purple color from UV exposure during scar maturation process - Discussed that erythema and raised appearance of scar will fade over the next 4-6 months - OK to start scar massage at 4-6 weeks post-op - Can consider silicone based products for scar healing starting at 6 weeks post-op - Ok to continue ointment daily to wound under a bandage for another week  HISTORY OF BASAL CELL CARCINOMA OF THE SKIN - No evidence of recurrence today - Recommend regular full body skin exams - Recommend daily broad spectrum sunscreen SPF 30+ to  sun-exposed areas, reapply every 2 hours as needed.  - Call if any new or changing lesions are noted between office visits  Return if symptoms worsen or fail to improve.  I, Darice Smock, CMA, am acting as scribe for RUFUS CHRISTELLA HOLY, MD. =  Documentation: I have reviewed the above documentation for accuracy and completeness, and I agree with the above.  RUFUS CHRISTELLA HOLY, MD

## 2024-06-09 NOTE — Patient Instructions (Signed)

## 2024-08-12 ENCOUNTER — Ambulatory Visit: Admitting: Physician Assistant

## 2024-09-11 ENCOUNTER — Encounter: Payer: Self-pay | Admitting: Gastroenterology

## 2024-10-21 ENCOUNTER — Encounter
